# Patient Record
Sex: Female | Born: 1955
Health system: Southern US, Community
[De-identification: ages and names within clinical notes are randomized; demographics above are authoritative.]

## PROBLEM LIST (undated history)

## (undated) DIAGNOSIS — T8859XA Other complications of anesthesia, initial encounter: Secondary | ICD-10-CM

## (undated) DIAGNOSIS — E049 Nontoxic goiter, unspecified: Secondary | ICD-10-CM

## (undated) DIAGNOSIS — M797 Fibromyalgia: Secondary | ICD-10-CM

## (undated) DIAGNOSIS — J189 Pneumonia, unspecified organism: Secondary | ICD-10-CM

## (undated) DIAGNOSIS — R51 Headache: Secondary | ICD-10-CM

## (undated) DIAGNOSIS — T4145XA Adverse effect of unspecified anesthetic, initial encounter: Secondary | ICD-10-CM

## (undated) DIAGNOSIS — F329 Major depressive disorder, single episode, unspecified: Secondary | ICD-10-CM

## (undated) DIAGNOSIS — M419 Scoliosis, unspecified: Secondary | ICD-10-CM

## (undated) DIAGNOSIS — Z9189 Other specified personal risk factors, not elsewhere classified: Secondary | ICD-10-CM

## (undated) DIAGNOSIS — E785 Hyperlipidemia, unspecified: Secondary | ICD-10-CM

## (undated) DIAGNOSIS — K219 Gastro-esophageal reflux disease without esophagitis: Secondary | ICD-10-CM

## (undated) DIAGNOSIS — D649 Anemia, unspecified: Secondary | ICD-10-CM

## (undated) DIAGNOSIS — K449 Diaphragmatic hernia without obstruction or gangrene: Secondary | ICD-10-CM

## (undated) DIAGNOSIS — G9332 Myalgic encephalomyelitis/chronic fatigue syndrome: Secondary | ICD-10-CM

## (undated) DIAGNOSIS — K589 Irritable bowel syndrome without diarrhea: Secondary | ICD-10-CM

## (undated) DIAGNOSIS — F32A Depression, unspecified: Secondary | ICD-10-CM

## (undated) DIAGNOSIS — M1990 Unspecified osteoarthritis, unspecified site: Secondary | ICD-10-CM

## (undated) DIAGNOSIS — E739 Lactose intolerance, unspecified: Secondary | ICD-10-CM

## (undated) DIAGNOSIS — IMO0001 Reserved for inherently not codable concepts without codable children: Secondary | ICD-10-CM

## (undated) DIAGNOSIS — R5382 Chronic fatigue, unspecified: Secondary | ICD-10-CM

## (undated) DIAGNOSIS — Z889 Allergy status to unspecified drugs, medicaments and biological substances status: Secondary | ICD-10-CM

## (undated) DIAGNOSIS — N390 Urinary tract infection, site not specified: Secondary | ICD-10-CM

## (undated) HISTORY — DX: Myalgic encephalomyelitis/chronic fatigue syndrome: G93.32

## (undated) HISTORY — DX: Pneumonia, unspecified organism: J18.9

## (undated) HISTORY — DX: Anemia, unspecified: D64.9

## (undated) HISTORY — DX: Reserved for inherently not codable concepts without codable children: IMO0001

## (undated) HISTORY — DX: Scoliosis, unspecified: M41.9

## (undated) HISTORY — PX: FOOT SURGERY: SHX648

## (undated) HISTORY — DX: Major depressive disorder, single episode, unspecified: F32.9

## (undated) HISTORY — DX: Diaphragmatic hernia without obstruction or gangrene: K44.9

## (undated) HISTORY — DX: Lactose intolerance, unspecified: E73.9

## (undated) HISTORY — DX: Urinary tract infection, site not specified: N39.0

## (undated) HISTORY — DX: Gastro-esophageal reflux disease without esophagitis: K21.9

## (undated) HISTORY — DX: Unspecified osteoarthritis, unspecified site: M19.90

## (undated) HISTORY — DX: Hyperlipidemia, unspecified: E78.5

## (undated) HISTORY — DX: Headache: R51

## (undated) HISTORY — DX: Depression, unspecified: F32.A

## (undated) HISTORY — DX: Irritable bowel syndrome without diarrhea: K58.9

## (undated) HISTORY — PX: NOSE SURGERY: SHX723

## (undated) HISTORY — DX: Chronic fatigue, unspecified: R53.82

## (undated) HISTORY — PX: TONSILLECTOMY: SUR1361

---

## 2000-11-02 ENCOUNTER — Ambulatory Visit (HOSPITAL_COMMUNITY): Admission: RE | Admit: 2000-11-02 | Discharge: 2000-11-02 | Payer: Self-pay | Admitting: Family Medicine

## 2000-11-02 ENCOUNTER — Encounter: Payer: Self-pay | Admitting: Family Medicine

## 2001-10-05 ENCOUNTER — Encounter: Admission: RE | Admit: 2001-10-05 | Discharge: 2001-10-05 | Payer: Self-pay | Admitting: Family Medicine

## 2001-10-05 ENCOUNTER — Encounter: Payer: Self-pay | Admitting: Family Medicine

## 2004-05-03 ENCOUNTER — Other Ambulatory Visit: Admission: RE | Admit: 2004-05-03 | Discharge: 2004-05-03 | Payer: Self-pay | Admitting: Family Medicine

## 2004-12-25 ENCOUNTER — Emergency Department (HOSPITAL_COMMUNITY): Admission: EM | Admit: 2004-12-25 | Discharge: 2004-12-25 | Payer: Self-pay | Admitting: Emergency Medicine

## 2005-02-19 ENCOUNTER — Encounter: Admission: RE | Admit: 2005-02-19 | Discharge: 2005-02-19 | Payer: Self-pay | Admitting: Specialist

## 2005-02-22 ENCOUNTER — Encounter: Admission: RE | Admit: 2005-02-22 | Discharge: 2005-02-22 | Payer: Self-pay | Admitting: Specialist

## 2005-08-21 ENCOUNTER — Encounter: Admission: RE | Admit: 2005-08-21 | Discharge: 2005-08-21 | Payer: Self-pay | Admitting: Family Medicine

## 2007-03-09 ENCOUNTER — Encounter: Admission: RE | Admit: 2007-03-09 | Discharge: 2007-03-09 | Payer: Self-pay | Admitting: Family Medicine

## 2007-05-24 ENCOUNTER — Other Ambulatory Visit: Admission: RE | Admit: 2007-05-24 | Discharge: 2007-05-24 | Payer: Self-pay | Admitting: Family Medicine

## 2008-04-11 ENCOUNTER — Encounter: Admission: RE | Admit: 2008-04-11 | Discharge: 2008-04-11 | Payer: Self-pay | Admitting: Family Medicine

## 2008-04-18 ENCOUNTER — Encounter: Admission: RE | Admit: 2008-04-18 | Discharge: 2008-04-18 | Payer: Self-pay | Admitting: Family Medicine

## 2008-12-05 ENCOUNTER — Other Ambulatory Visit: Admission: RE | Admit: 2008-12-05 | Discharge: 2008-12-05 | Payer: Self-pay | Admitting: Family Medicine

## 2010-03-29 ENCOUNTER — Encounter: Admission: RE | Admit: 2010-03-29 | Discharge: 2010-03-29 | Payer: Self-pay | Admitting: Family Medicine

## 2010-05-31 ENCOUNTER — Encounter: Admission: RE | Admit: 2010-05-31 | Discharge: 2010-05-31 | Payer: Self-pay | Admitting: Family Medicine

## 2011-04-08 ENCOUNTER — Encounter: Payer: Self-pay | Admitting: Internal Medicine

## 2011-05-22 ENCOUNTER — Ambulatory Visit (HOSPITAL_COMMUNITY)
Admission: RE | Admit: 2011-05-22 | Payer: BC Managed Care – PPO | Source: Ambulatory Visit | Admitting: Gastroenterology

## 2011-05-30 ENCOUNTER — Encounter: Payer: Self-pay | Admitting: Internal Medicine

## 2011-05-30 ENCOUNTER — Ambulatory Visit (INDEPENDENT_AMBULATORY_CARE_PROVIDER_SITE_OTHER): Payer: BC Managed Care – PPO | Admitting: Internal Medicine

## 2011-05-30 VITALS — BP 120/70 | HR 68 | Ht 66.75 in | Wt 172.2 lb

## 2011-05-30 DIAGNOSIS — R195 Other fecal abnormalities: Secondary | ICD-10-CM

## 2011-05-30 DIAGNOSIS — Z800 Family history of malignant neoplasm of digestive organs: Secondary | ICD-10-CM

## 2011-05-30 MED ORDER — PEG-KCL-NACL-NASULF-NA ASC-C 100 G PO SOLR: 1.0000 | Freq: Once | ORAL | Status: DC

## 2011-05-30 NOTE — Patient Instructions (Signed)
You have been scheduled for an endoscopy and colonoscopy without sedation. Please follow written instructions given to you at your visit today.  Please pick up your Moviprep kit at the pharmacy within the next 2-3 days. CC: Dr Holley Bouche

## 2011-05-30 NOTE — Progress Notes (Signed)
Tanya Thomas 23-Jan-1956 MRN [id number]    History of Present Illness:  This is a 55 year old white female with a positive Hemoccult on home testing. She has done home testing in prior years and they were always negative. She denies any visible blood per rectum. There is a family history of colon cancer in her father at age 72. She has regular bowel habits; 1-2 bowel movements a day. She has multiple allergies including lactose intolerance. She brought with her list of allergies and has done research on all of them including sedatives. She is concerned about having endoscopic procedures due to sedation. She would like to have the procedure done without sedation. She also researched virtual colonoscopy or barium enema. She had a flexible sigmoidoscopy in 1989 by Dr Corinda Gubler who raised a question of colitis. No biopsies were taken at that time.   Past Medical History  Diagnosis Date  . Lactose intolerance   . IBS (irritable bowel syndrome)   . Chronic fatigue syndrome   . Reflux   . Anemia   . Arthritis   . Asthma   . Scoliosis   . Depression   . Hiatal hernia   . Hyperlipemia   . Pneumonia   . UTI (lower urinary tract infection)   . Headache    Past Surgical History  Procedure Date  . Tonsillectomy   . Feet surgery     bilateral bunion removal  . Nose surgery     reports that she has never smoked. She has never used smokeless tobacco. She reports that she does not drink alcohol or use illicit drugs. family history includes Breast cancer in her maternal aunt; Cancer in her maternal grandmother; Colon cancer in her father; Irritable bowel syndrome in her sister; Lung cancer in her maternal uncle; Pancreatic cancer in her maternal aunt; Prostate cancer in her maternal uncle; and Stomach cancer in her maternal aunt. Allergies  Allergen Reactions  . Lactose Intolerance (Gi)   . Latex   . Penicillins   . Sulfa Antibiotics   . Sulfites         Review of Systems: Denies dysphagia  odynophagia abdominal pain or hematochezia  The remainder of the 10 point ROS is negative except as outlined in H&P   Physical Exam: General appearance  Well developed, in no distress. Eyes- non icteric. HEENT nontraumatic, normocephalic. Mouth no lesions, tongue papillated, no cheilosis. Neck supple without adenopathy, thyroid not enlarged, no carotid bruits, no JVD. Lungs Clear to auscultation bilaterally. Cor normal S1, normal S2, regular rhythm, no murmur,  quiet precordium. Abdomen: Soft abdomen with normoactive bowel sounds. Left lower quadrant tenderness. No distention. No tympany. Rectal: Soft Hemoccult negative stool. Extremities no pedal edema. Skin no lesions. Neurological alert and oriented x 3. Psychological normal mood and affect, Very anxious.     Assessment and Plan:  Problem #1 Hemoccult-positive stool on home testing. It could not be reproduced on today's exam. She has risk factors for colon cancer. She is an appropriate candidate for a screening colonoscopy. She has several personal objections to sedation. We have discussed alternatives such as virtual colonoscopy or barium enema. Both procedures would be diagnostic only, and she would have to possibly have a colonoscopy if they show polyps. She would like to try the procedures without sedation. It is possible to go through a colonoscopy without sedation providing that her colon is easy to traverse. She says she would accept some sedation during the procedure if needed. She requested a flexible sigmoidoscopy  but it is my opinion that it would not be sufficient for a colorectal screening. She also would like to have an upper endoscopy without sedation. She will not be able to take oral anesthetic. We went ahead and scheduled both procedures tentatively without sedation. She will make further decisions as we go.  05/30/2011 Lina Sar

## 2011-06-11 ENCOUNTER — Telehealth: Payer: Self-pay | Admitting: Internal Medicine

## 2011-06-11 NOTE — Telephone Encounter (Signed)
This pt was extremely anxious about scheduling her colonoscopy. I will not be able to talk to her on the phone due to time constrains since she carries on with her concerns. I will see her in the office again .

## 2011-06-11 NOTE — Telephone Encounter (Signed)
Patient calling because she cannot take the Moviprep. It has lemon oil in it. She is allergic to lemons, grapefruit, oranges, limes. These cause headaches instantly for patient.  She has Nulightly at her home and she thinks she could take this. She is asking a lot of questions about sedation. She is very concerned about any possibility of getting sedation during the procedure. She is very afraid of reacting to anything she would be given. Patient wants to know who would decide to give her sedation and when it would be decided.(her procedure is scheduled without sedation) She has many questions and would like to speak with Dr. Juanda Chance. OV? Please, advise.

## 2011-06-12 NOTE — Telephone Encounter (Signed)
Unable to reach patient will try again later 

## 2011-06-12 NOTE — Telephone Encounter (Signed)
Spoke with patient and scheduled OV for 06/23/11 10:00 AM with Dr. Juanda Chance.

## 2011-06-12 NOTE — Telephone Encounter (Signed)
Spoke with patient and she will call me when she decides she can come for the appointment on 06/23/11

## 2011-06-16 ENCOUNTER — Other Ambulatory Visit: Payer: Self-pay | Admitting: Family Medicine

## 2011-06-16 DIAGNOSIS — Z1231 Encounter for screening mammogram for malignant neoplasm of breast: Secondary | ICD-10-CM

## 2011-06-20 ENCOUNTER — Other Ambulatory Visit: Payer: Self-pay | Admitting: Family Medicine

## 2011-06-20 ENCOUNTER — Ambulatory Visit
Admission: RE | Admit: 2011-06-20 | Discharge: 2011-06-20 | Disposition: A | Discharge status: Home / Self Care | Payer: BC Managed Care – PPO | Source: Ambulatory Visit | Attending: Family Medicine | Admitting: Family Medicine

## 2011-06-20 DIAGNOSIS — E0789 Other specified disorders of thyroid: Secondary | ICD-10-CM

## 2011-06-23 ENCOUNTER — Ambulatory Visit (INDEPENDENT_AMBULATORY_CARE_PROVIDER_SITE_OTHER): Payer: BC Managed Care – PPO | Admitting: Internal Medicine

## 2011-06-23 ENCOUNTER — Encounter: Payer: Self-pay | Admitting: Internal Medicine

## 2011-06-23 VITALS — BP 128/74 | HR 68 | Ht 66.0 in | Wt 172.0 lb

## 2011-06-23 DIAGNOSIS — R195 Other fecal abnormalities: Secondary | ICD-10-CM

## 2011-06-23 NOTE — Patient Instructions (Addendum)
CC: Dr Holley Bouche

## 2011-06-23 NOTE — Progress Notes (Signed)
Tanya Thomas 09/06/1955 MRN [id number]   History of Present Illness:  This is a 55 year old white female with Hemoccult-positive stool on a home test and a family history of colon cancer in her father at the age of 68. She was here on 05/30/2011 to discuss colonoscopy. She had anxiety concerning the procedure and sedation due to  multiple allergies. She was unable to make a decision as to the prep and type of sedation she wants. She came back today to continue the discussion about the procedure. She is afraid of Versed as well as of fentanyl and propofol. She now understands that a colonoscopy is the best way to screen for colon cancer. She says that she wants to be sure that she can survive her procedure and she wants to talk to the anesthesiologist to discuss her chances of complications with sedation. She is, however, not sure that she will follow anesthesiologist recommendations. She also has had hesitation concerning modification of her diet prior to the procedure since apparently she has multiple food allergies as well. She did not want a MoviPrep because it contains sulfide and she believes some of the preps contained sulfites as well.   Past Medical History  Diagnosis Date  . Lactose intolerance   . IBS (irritable bowel syndrome)   . Chronic fatigue syndrome   . Reflux   . Anemia   . Arthritis   . Asthma   . Scoliosis   . Depression   . Hiatal hernia   . Hyperlipemia   . Pneumonia   . UTI (lower urinary tract infection)   . Headache    Past Surgical History  Procedure Date  . Tonsillectomy   . Foot surgery     bilateral bunion removal  . Nose surgery     reports that she has never smoked. She has never used smokeless tobacco. She reports that she does not drink alcohol or use illicit drugs. family history includes Breast cancer in her maternal aunt; Cancer in her maternal grandmother; Colon cancer in her father; Irritable bowel syndrome in her sister; Lung cancer in her  maternal uncle; Pancreatic cancer in her maternal aunt; Prostate cancer in her maternal uncle; and Stomach cancer in her maternal aunt. Allergies  Allergen Reactions  . Sulfites Anaphylaxis  . Advil     Causes asthma   . Alcohol-Sulfur (Sulfur)   . Aspirin   . Cocaine   . Codeine   . Corn Starch   . Corn Syrup   . Demerol (Meperidine Hcl)   . Lactose Intolerance (Gi)   . Latex   . Lemon Oil   . Penicillins   . Phenergan   . Sulfa Antibiotics   . Tylenol (Acetaminophen)     Kidney infections         Review of Systems:  The remainder of the 10 point ROS is negative except as outlined in H&P    Assessment and Plan:  This is a 55 year old white female with excessive anxiety concerning a screening colonoscopy. She is at high risk for colon cancer because of her family history and personal history of heme positive stool. I have seen her now twice and we have not been able to resolve the basic questions of prep, type of sedation and/or no sedation and flexible sigmoidoscopy versus full colonoscopy. She will let me know about her decision. I will try to find out if there is a possibility of her meeting an anesthesiologist providing that she would respect  his recommendations.   06/23/2011 Lina Sar

## 2011-06-24 ENCOUNTER — Encounter: Payer: BC Managed Care – PPO | Admitting: Internal Medicine

## 2011-07-01 ENCOUNTER — Ambulatory Visit
Admission: RE | Admit: 2011-07-01 | Discharge: 2011-07-01 | Disposition: A | Discharge status: Home / Self Care | Payer: BC Managed Care – PPO | Source: Ambulatory Visit | Attending: Family Medicine | Admitting: Family Medicine

## 2011-07-01 DIAGNOSIS — Z1231 Encounter for screening mammogram for malignant neoplasm of breast: Secondary | ICD-10-CM

## 2011-07-03 ENCOUNTER — Encounter: Payer: BC Managed Care – PPO | Admitting: Internal Medicine

## 2012-02-16 ENCOUNTER — Other Ambulatory Visit: Payer: Self-pay | Admitting: Family Medicine

## 2012-02-16 DIAGNOSIS — E042 Nontoxic multinodular goiter: Secondary | ICD-10-CM

## 2012-02-19 ENCOUNTER — Ambulatory Visit
Admission: RE | Admit: 2012-02-19 | Discharge: 2012-02-19 | Disposition: A | Discharge status: Home / Self Care | Payer: BC Managed Care – PPO | Source: Ambulatory Visit | Attending: Family Medicine | Admitting: Family Medicine

## 2012-02-19 DIAGNOSIS — E042 Nontoxic multinodular goiter: Secondary | ICD-10-CM

## 2012-06-16 ENCOUNTER — Other Ambulatory Visit: Payer: Self-pay | Admitting: Family Medicine

## 2012-06-16 ENCOUNTER — Ambulatory Visit
Admission: RE | Admit: 2012-06-16 | Discharge: 2012-06-16 | Disposition: A | Discharge status: Home / Self Care | Payer: BC Managed Care – PPO | Source: Ambulatory Visit | Attending: Family Medicine | Admitting: Family Medicine

## 2012-06-16 DIAGNOSIS — N63 Unspecified lump in unspecified breast: Secondary | ICD-10-CM

## 2012-06-16 DIAGNOSIS — Z1231 Encounter for screening mammogram for malignant neoplasm of breast: Secondary | ICD-10-CM

## 2012-08-17 ENCOUNTER — Other Ambulatory Visit: Payer: Self-pay | Admitting: Family Medicine

## 2012-08-17 DIAGNOSIS — E042 Nontoxic multinodular goiter: Secondary | ICD-10-CM

## 2012-08-20 ENCOUNTER — Ambulatory Visit
Admission: RE | Admit: 2012-08-20 | Discharge: 2012-08-20 | Disposition: A | Discharge status: Home / Self Care | Payer: BC Managed Care – PPO | Source: Ambulatory Visit | Attending: Family Medicine | Admitting: Family Medicine

## 2012-08-20 DIAGNOSIS — E042 Nontoxic multinodular goiter: Secondary | ICD-10-CM

## 2014-05-12 ENCOUNTER — Other Ambulatory Visit: Payer: Self-pay | Admitting: Family Medicine

## 2014-05-12 ENCOUNTER — Other Ambulatory Visit (HOSPITAL_COMMUNITY)
Admission: RE | Admit: 2014-05-12 | Discharge: 2014-05-12 | Disposition: A | Discharge status: Home / Self Care | Payer: BC Managed Care – PPO | Source: Ambulatory Visit | Attending: Family Medicine | Admitting: Family Medicine

## 2014-05-12 DIAGNOSIS — Z0000 Encounter for general adult medical examination without abnormal findings: Secondary | ICD-10-CM | POA: Diagnosis not present

## 2014-05-16 ENCOUNTER — Other Ambulatory Visit: Payer: Self-pay | Admitting: Family Medicine

## 2014-05-16 DIAGNOSIS — E042 Nontoxic multinodular goiter: Secondary | ICD-10-CM

## 2014-05-16 LAB — CYTOLOGY - PAP

## 2014-05-19 ENCOUNTER — Ambulatory Visit
Admission: RE | Admit: 2014-05-19 | Discharge: 2014-05-19 | Disposition: A | Discharge status: Home / Self Care | Payer: BC Managed Care – PPO | Source: Ambulatory Visit | Attending: Family Medicine | Admitting: Family Medicine

## 2014-05-19 DIAGNOSIS — E042 Nontoxic multinodular goiter: Secondary | ICD-10-CM

## 2014-06-16 ENCOUNTER — Encounter (HOSPITAL_COMMUNITY): Payer: Self-pay | Admitting: Pharmacy Technician

## 2014-06-22 ENCOUNTER — Encounter (HOSPITAL_COMMUNITY): Payer: Self-pay | Admitting: *Deleted

## 2014-06-28 ENCOUNTER — Encounter (HOSPITAL_COMMUNITY)
Admission: RE | Admit: 2014-06-28 | Discharge: 2014-06-28 | Disposition: A | Discharge status: Home / Self Care | Payer: BC Managed Care – PPO | Source: Ambulatory Visit | Attending: Gastroenterology | Admitting: Gastroenterology

## 2014-06-28 ENCOUNTER — Encounter (HOSPITAL_COMMUNITY): Payer: Self-pay | Admitting: *Deleted

## 2014-06-28 ENCOUNTER — Ambulatory Visit (HOSPITAL_COMMUNITY): Payer: BC Managed Care – PPO | Admitting: Anesthesiology

## 2014-06-28 DIAGNOSIS — Z01818 Encounter for other preprocedural examination: Secondary | ICD-10-CM | POA: Insufficient documentation

## 2014-06-28 NOTE — Anesthesia Preprocedure Evaluation (Deleted)
Anesthesia Evaluation  Patient identified by MRN, date of birth, ID band Patient awake    Reviewed: Allergy & Precautions, H&P , NPO status , Patient's Chart, lab work & pertinent test results  Airway Mallampati: II  TM Distance: >3 FB Neck ROM: Full    Dental no notable dental hx.    Pulmonary asthma , pneumonia -,  breath sounds clear to auscultation  Pulmonary exam normal       Cardiovascular negative cardio ROS  Rhythm:Regular Rate:Normal     Neuro/Psych  Headaches, PSYCHIATRIC DISORDERS Depression  Neuromuscular disease    GI/Hepatic Neg liver ROS, hiatal hernia,   Endo/Other  negative endocrine ROS  Renal/GU negative Renal ROS  negative genitourinary   Musculoskeletal  (+) Arthritis -, Fibromyalgia -  Abdominal   Peds negative pediatric ROS (+)  Hematology  (+) anemia ,   Anesthesia Other Findings   Reproductive/Obstetrics negative OB ROS                             Anesthesia Physical Anesthesia Plan  ASA: II  Anesthesia Plan: MAC   Post-op Pain Management:    Induction: Intravenous  Airway Management Planned:   Additional Equipment:   Intra-op Plan:   Post-operative Plan: Extubation in OR  Informed Consent: I have reviewed the patients History and Physical, chart, labs and discussed the procedure including the risks, benefits and alternatives for the proposed anesthesia with the patient or authorized representative who has indicated his/her understanding and acceptance.   Dental advisory given  Plan Discussed with: CRNA  Anesthesia Plan Comments: (Patient very concerned about a drug allergy. Pharmacy has been called and our propofol does not have sulfites.)        Anesthesia Quick Evaluation

## 2014-06-28 NOTE — Pre-Procedure Instructions (Signed)
06-28-14 1310 Patient here for pre anesthesia consult, discussed allergy issues and  Anesthesia concerns with Dr. Eliseo Squires.

## 2014-07-05 ENCOUNTER — Other Ambulatory Visit: Payer: Self-pay | Admitting: Gastroenterology

## 2014-07-06 ENCOUNTER — Encounter (HOSPITAL_COMMUNITY)
Admission: RE | Disposition: A | Discharge status: Home / Self Care | Payer: Self-pay | Source: Ambulatory Visit | Attending: Gastroenterology

## 2014-07-06 ENCOUNTER — Ambulatory Visit (HOSPITAL_COMMUNITY)
Admission: RE | Admit: 2014-07-06 | Discharge: 2014-07-06 | Disposition: A | Discharge status: Home / Self Care | Payer: BC Managed Care – PPO | Source: Ambulatory Visit | Attending: Gastroenterology | Admitting: Gastroenterology

## 2014-07-06 ENCOUNTER — Encounter (HOSPITAL_COMMUNITY): Payer: Self-pay | Admitting: Gastroenterology

## 2014-07-06 DIAGNOSIS — E785 Hyperlipidemia, unspecified: Secondary | ICD-10-CM | POA: Insufficient documentation

## 2014-07-06 DIAGNOSIS — D649 Anemia, unspecified: Secondary | ICD-10-CM | POA: Insufficient documentation

## 2014-07-06 DIAGNOSIS — R5382 Chronic fatigue, unspecified: Secondary | ICD-10-CM | POA: Diagnosis not present

## 2014-07-06 DIAGNOSIS — M419 Scoliosis, unspecified: Secondary | ICD-10-CM | POA: Diagnosis not present

## 2014-07-06 DIAGNOSIS — K589 Irritable bowel syndrome without diarrhea: Secondary | ICD-10-CM | POA: Insufficient documentation

## 2014-07-06 DIAGNOSIS — R195 Other fecal abnormalities: Secondary | ICD-10-CM | POA: Diagnosis present

## 2014-07-06 DIAGNOSIS — D127 Benign neoplasm of rectosigmoid junction: Secondary | ICD-10-CM | POA: Insufficient documentation

## 2014-07-06 DIAGNOSIS — Z882 Allergy status to sulfonamides status: Secondary | ICD-10-CM | POA: Diagnosis not present

## 2014-07-06 DIAGNOSIS — D120 Benign neoplasm of cecum: Secondary | ICD-10-CM | POA: Diagnosis not present

## 2014-07-06 DIAGNOSIS — F329 Major depressive disorder, single episode, unspecified: Secondary | ICD-10-CM | POA: Insufficient documentation

## 2014-07-06 DIAGNOSIS — J45909 Unspecified asthma, uncomplicated: Secondary | ICD-10-CM | POA: Diagnosis not present

## 2014-07-06 DIAGNOSIS — K219 Gastro-esophageal reflux disease without esophagitis: Secondary | ICD-10-CM | POA: Diagnosis not present

## 2014-07-06 DIAGNOSIS — M797 Fibromyalgia: Secondary | ICD-10-CM | POA: Insufficient documentation

## 2014-07-06 DIAGNOSIS — Z800 Family history of malignant neoplasm of digestive organs: Secondary | ICD-10-CM | POA: Diagnosis not present

## 2014-07-06 HISTORY — DX: Other specified personal risk factors, not elsewhere classified: Z91.89

## 2014-07-06 HISTORY — DX: Adverse effect of unspecified anesthetic, initial encounter: T41.45XA

## 2014-07-06 HISTORY — DX: Nontoxic goiter, unspecified: E04.9

## 2014-07-06 HISTORY — PX: COLONOSCOPY WITH PROPOFOL: SHX5780

## 2014-07-06 HISTORY — PX: ESOPHAGOGASTRODUODENOSCOPY (EGD) WITH PROPOFOL: SHX5813

## 2014-07-06 HISTORY — DX: Other complications of anesthesia, initial encounter: T88.59XA

## 2014-07-06 HISTORY — DX: Allergy status to unspecified drugs, medicaments and biological substances: Z88.9

## 2014-07-06 HISTORY — DX: Fibromyalgia: M79.7

## 2014-07-06 SURGERY — ESOPHAGOGASTRODUODENOSCOPY (EGD) WITH PROPOFOL
Anesthesia: Monitor Anesthesia Care

## 2014-07-06 MED ORDER — PROPOFOL 10 MG/ML IV BOLUS
INTRAVENOUS | Status: AC
  Filled 2014-07-06: qty 20

## 2014-07-06 MED ORDER — SODIUM CHLORIDE 0.9 % IV SOLN: INTRAVENOUS | Status: DC

## 2014-07-06 SURGICAL SUPPLY — 25 items

## 2014-07-06 NOTE — H&P (Signed)
Subjective:   Patient is a 58 y.o. female presents with family history of colon cancer and positive stools.patient has severe sulfite allergy and this is been discussed in detail with her. She has met with anesthesia.. Procedure including risks and benefits discussed in office.  There are no active problems to display for this patient.  Past Medical History  Diagnosis Date  . Lactose intolerance   . IBS (irritable bowel syndrome)   . Chronic fatigue syndrome   . Reflux   . Anemia   . Arthritis   . Asthma   . Scoliosis   . Depression   . Hiatal hernia   . Hyperlipemia   . Pneumonia   . UTI (lower urinary tract infection)   . Headache(784.0)   . Fibromyalgia   . Goiter     multi-nodular goiter  . H/O multiple allergies     extreme with "Sulfites"    Past Surgical History  Procedure Laterality Date  . Tonsillectomy    . Foot surgery      bilateral bunion removal  . Nose surgery      Prescriptions prior to admission  Medication Sig Dispense Refill Last Dose  . albuterol (PROVENTIL) (2.5 MG/3ML) 0.083% nebulizer solution Take 2.5 mg by nebulization every 6 (six) hours as needed for wheezing or shortness of breath.    Past Week at Unknown time  . B Complex-C (B-COMPLEX WITH VITAMIN C) tablet Take 1 tablet by mouth daily.   Past Week at Unknown time  . carbamide peroxide (DEBROX) 6.5 % otic solution Place 5 drops into both ears as needed (wax).   Past Week at Unknown time  . diphenhydrAMINE (BENADRYL) 12.5 MG/5ML liquid Take 50 mg by mouth every 8 (eight) hours as needed for allergies. childrens benadryl   Past Week at Unknown time  . GuaiFENesin (MUCINEX FOR KIDS PO) Take 1 tablet by mouth at bedtime. (minimelts for kids)   07/05/2014 at 0900  . loratadine (CLARITIN) 5 MG/5ML syrup Take 10 mg by mouth as needed for allergies or rhinitis.    07/05/2014 at 1600  . OVER THE COUNTER MEDICATION Take 3-5 tablets by mouth at bedtime and may repeat dose one time if needed. (Solgar  Brand) Calcium Magnesium Vitamin d3 400 iu Cholecalciferol  500 Magnesium Oxide 1000 Calcium Citrate   Past Week at Unknown time  . ranitidine (ZANTAC) 150 MG tablet Take 150 mg by mouth 2 (two) times daily. (Can only take the one that has Red Oxide)   07/05/2014 at 1200  . sodium chloride (OCEAN) 0.65 % SOLN nasal spray Place 2 sprays into both nostrils daily as needed for congestion.   07/05/2014 at 2359  . EPINEPHrine (EPIPEN JR) 0.15 MG/0.3ML injection Inject 0.15 mg into the muscle as needed.     More than a month at Unknown time  . loratadine (CLARITIN) 5 MG/5ML syrup Take 10 mg by mouth daily as needed for allergies or rhinitis. Dye-Free.     . magnesium citrate 1.745 GM/30ML SOLN Take 296 mLs by mouth at bedtime.     More than a month at Unknown time  . peg 3350 powder (MOVIPREP) 100 G SOLR Take 1 kit (100 g total) by mouth once. 1 kit 0 More than a month at Unknown time   Allergies  Allergen Reactions  . Sulfites Anaphylaxis    FCD dye  . Alcohol-Sulfur [Sulfur]   . Aspirin     Purple spots all over back and passed out.   Marland Kitchen  Cocaine   . Codeine   . Corn Starch   . Corn Syrup   . Demerol [Meperidine Hcl]     Drop in blood pressure, jerking.   . Ibuprofen     Causes asthma   . Lactose Intolerance (Gi)   . Lemon Flavor     All citrus fruits and products-lemons, lime, oranges, grapefruit, tangerines, tangelos, clementines--ALL MAKE HER HAVE MIGRAINES  . Lemon Oil     Used in sleep study.(Lemon Prep)Face swelling.  . Monosodium Glutamate     migraines  . Nickel     Jewelry-Red rash, itching  . Penicillins     Purple spots all over back and passed out.   . Promethazine Hcl     Bp dropped and a feeling of passing out.  . Sulfa Antibiotics   . Tylenol [Acetaminophen]     Kidney infections   . Wine [Alcohol]     Throat swells  . Adhesive [Tape] Rash    Surgical tape-3M-rash NEEDS HYPOFIX TAPE -WORKS BETTER FOR HER!  . Latex Rash    History  Substance Use Topics  .  Smoking status: Never Smoker   . Smokeless tobacco: Never Used  . Alcohol Use: No    Family History  Problem Relation Age of Onset  . Colon cancer Father   . Irritable bowel syndrome Sister   . Breast cancer Maternal Aunt   . Pancreatic cancer Maternal Aunt   . Stomach cancer Maternal Aunt   . Prostate cancer Maternal Uncle   . Lung cancer Maternal Uncle   . Cancer Maternal Grandmother     rhabdomysarcoma     Objective:   Patient Vitals for the past 8 hrs:  BP Temp Temp src Pulse Resp SpO2 Height Weight  07/06/14 0807 (!) 165/98 mmHg 97.9 F (36.6 C) Oral 75 10 97 % 5' 6" (1.676 m) 79.379 kg (175 lb)         See MD Preop evaluation      Assessment:   1. Heme positive stool with family history of colon cancer 2. Sulfite allergy  Plan:   Will proceed with EGD and colonoscopy. At the patient's request she has chosen to do these procedures with no sedation. Anesthesia will be available if needed.   

## 2014-07-06 NOTE — Discharge Instructions (Signed)
No aspirin, ibuprofen or other NSAID medications for 5 days. Office will send note or call when pathology results are obtained. Colonoscopy will be repeated based on the pathology results.Esophagogastroduodenoscopy Care After Refer to this sheet in the next few weeks. These instructions provide you with information on caring for yourself after your procedure. Your caregiver may also give you more specific instructions. Your treatment has been planned according to current medical practices, but problems sometimes occur. Call your caregiver if you have any problems or questions after your procedure.  HOME CARE INSTRUCTIONS  Do not eat or drink anything until the numbing medicine (local anesthetic) has worn off and your gag reflex has returned. You will know that the local anesthetic has worn off when you can swallow comfortably.  Do not drive for 12 hours after the procedure or as directed by your caregiver.  Only take medicines as directed by your caregiver. SEEK MEDICAL CARE IF:   You cannot stop coughing.  You are not urinating at all or less than usual. SEEK IMMEDIATE MEDICAL CARE IF:  You have difficulty swallowing.  You cannot eat or drink.  You have worsening throat or chest pain.  You have dizziness, lightheadedness, or you faint.  You have nausea or vomiting.  You have chills.  You have a fever.  You have severe abdominal pain.  You have black, tarry, or bloody stools. Document Released: 07/28/2012 Document Reviewed: 07/28/2012 [facility] Patient Information 2015 [place]. This information is not intended to replace advice given to you by your health care provider. Make sure you discuss any questions you have with your health care provider.   Colonoscopy, Care After These instructions give you information on caring for yourself after your procedure. Your doctor may also give you more specific instructions. Call your doctor if you have any problems or questions  after your procedure. HOME CARE  Do not drive for 24 hours.  Do not sign important papers or use machinery for 24 hours.  You may shower.  You may go back to your usual activities, but go slower for the first 24 hours.  Take rest breaks often during the first 24 hours.  Walk around or use warm packs on your belly (abdomen) if you have belly cramping or gas.  Drink enough fluids to keep your pee (urine) clear or pale yellow.  Resume your normal diet. Avoid heavy or fried foods.  Avoid drinking alcohol for 24 hours or as told by your doctor.  Only take medicines as told by your doctor. If a tissue sample (biopsy) was taken during the procedure:   Do not take aspirin or blood thinners for 7 days, or as told by your doctor.  Do not drink alcohol for 7 days, or as told by your doctor.  Eat soft foods for the first 24 hours. GET HELP IF: You still have a small amount of blood in your poop (stool) 2-3 days after the procedure. GET HELP RIGHT AWAY IF:  You have more than a small amount of blood in your poop.  You see clumps of tissue (blood clots) in your poop.  Your belly is puffy (swollen).  You feel sick to your stomach (nauseous) or throw up (vomit).  You have a fever.  You have belly pain that gets worse and medicine does not help. MAKE SURE YOU:  Understand these instructions.  Will watch your condition.  Will get help right away if you are not doing well or get worse. Document Released: 09/13/2010  Document Revised: 08/16/2013 Document Reviewed: 04/18/2013 [facility] Patient Information 2015 [place], [place]. This information is not intended to replace advice given to you by your health care provider. Make sure you discuss any questions you have with your health care provider.

## 2014-07-06 NOTE — Op Note (Signed)
[facility] [place], [address]   COLONOSCOPY PROCEDURE REPORT     EXAM DATE: 07/06/2014  PATIENT NAME:      Tanya Thomas, Tanya Thomas           MR #:      [id number]  BIRTHDATE:       [birth year]      VISIT #:     [phone]  ATTENDING:     Laurence Spates, MD     STATUS:     outpatient REFERRING MD:      Shirline Frees, M.D. ASA CLASS:        Class III  INDICATIONS:  The patient is a 58 yr old female here for a colonoscopy due to heme positive stool and family history of colon cancer. PROCEDURE PERFORMED:     Colonoscopy with snare polypectomy MEDICATIONS:     patient received no medications at her request due to her sulfite allergy ESTIMATED BLOOD LOSS:     None  CONSENT: The patient understands the risks and benefits of the procedure and understands that these risks include, but are not limited to: sedation, allergic reaction, infection, perforation and/or bleeding. Alternative means of evaluation and treatment include, among others: physical exam, x-rays, and/or surgical intervention. The patient elects to proceed with this endoscopic procedure.  DESCRIPTION OF PROCEDURE: During intra-op preparation period all mechanical & medical equipment was checked for proper function. Hand hygiene and appropriate measures for infection prevention was taken. After the risks, benefits and alternatives of the procedure were thoroughly explained, Informed consent was verified, confirmed and timeout was successfully executed by the treatment team. A digital exam revealed no abnormalities of the rectum.      The Pentax Ped Colon [id number] endoscope was introduced through the anus and advanced to the cecum, which was identified by both the appendix and ileocecal valve. No adverse events experienced. The prep was excellent, using Nulytley. The instrument was then slowly withdrawn as the colon was fully examined.   COLON FINDINGS: Three-quarter centimeters sessile  polyp located in the cecum across from the ileocecal valve removed with hot snare polypectomy.   1 cm polyp and rectosigmoid 22 cm pedunculated removed hot snare polypectomy. Retroflexed views revealed no abnormalities.  The scope was then completely withdrawn from the patient and the procedure terminated. WITHDRAWAL TIME: 25 minutes 0 seconds    ADVERSE EVENTS:      There were no immediate complications.  IMPRESSIONS:     1.  Three-quarter centimeters sessile polyp located in the cecum across from the ileocecal valve removed with hot snare polypectomy 2.  1 cm polyp and rectosigmoid 22 cm pedunculated removed hot snare polypectomy 3.  Family History of Colon Cancer  RECOMMENDATIONS:     Repeat procedure based on path results.  Avoid all NSAIDs for at least 5 days. RECALL:  Laurence Spates, MD eSigned:  Laurence Spates, MD 07/06/2014 10:15 AM   cc:  Shirline Frees MD  CPT CODES: ICD CODES:  The ICD and CPT codes recommended by this software are interpretations from the data that the clinical staff has captured with the software.  The verification of the translation of this report to the ICD and CPT codes and modifiers is the sole responsibility of the health care institution and practicing physician where this report was generated.  [employer]. will not be held responsible for the validity of the ICD and CPT codes included on this report.  AMA assumes no liability for  data contained or not contained herein. CPT is a Designer, television/film set of the [employer].

## 2014-07-06 NOTE — Addendum Note (Signed)
Addended by: Vernie Ammons. on: 07/06/2014 08:48 AM   Modules accepted: Orders

## 2014-07-06 NOTE — Progress Notes (Signed)
Patient complaining of some pressure on her right abdomen related to procedure, some relief with expelling gas. Now 4/10

## 2014-07-06 NOTE — Op Note (Signed)
[facility] [place], [address]   ENDOSCOPY PROCEDURE REPORT  PATIENT: Tanya Thomas, Tanya Thomas  MR#: [id number] BIRTHDATE: 26-May-1956 , 33  yrs. old GENDER: female ENDOSCOPIST: Laurence Spates, MD REFERRED BY:  Shirline Frees, M.D. PROCEDURE DATE:  07/06/2014 PROCEDURE:  EGD, diagnostic ASA CLASS:     Class III INDICATIONS:  heme positive stool. MEDICATIONS: patient originally scheduled propofol sedation.  She has sulfite allergy and elected to undergo procedure with no sedation and no topical anesthesia TOPICAL ANESTHETIC: none  DESCRIPTION OF PROCEDURE: After the risks benefits and alternatives of the procedure were thoroughly explained, informed consent was obtained.  The Pentax Gastroscope [id number] endoscope was introduced through the mouth and advanced to the second portion of the duodenum , Without limitations.  The instrument was slowly withdrawn as the mucosa was fully examined.      ESOPHAGUS: The mucosa of the esophagus appeared normal.  STOMACH: The mucosa of the stomach appeared normal.  DUODENUM: The duodenal mucosa showed no abnormalities.  Retroflexed views revealed no abnormalities.     The scope was then withdrawn from the patient and the procedure completed.  COMPLICATIONS: There were no immediate complications.    the patient did gag throughout the procedure making it somewhat difficult there were no immediate problems.  ENDOSCOPIC IMPRESSION: 1.   The mucosa of the esophagus appeared normal 2.   The mucosa of the stomach appeared normal 3.   The duodenal mucosa showed no abnormalities  RECOMMENDATIONS: We will proceed with colonoscopy at this time  REPEAT EXAM:  eSigned:  Laurence Spates, MD 07/06/2014 10:07 AM    ON:GEXBMW, Gwyndolyn Saxon MD  CPT CODES: ICD CODES:  The ICD and CPT codes recommended by this software are interpretations from the data that the clinical staff has captured with the software.  The verification  of the translation of this report to the ICD and CPT codes and modifiers is the sole responsibility of the health care institution and practicing physician where this report was generated.  [employer]. will not be held responsible for the validity of the ICD and CPT codes included on this report.  AMA assumes no liability for data contained or not contained herein. CPT is a Designer, television/film set of the [employer].  PATIENT NAME:  Billee, Balcerzak MR#: [id number]

## 2014-07-07 ENCOUNTER — Encounter (HOSPITAL_COMMUNITY): Payer: Self-pay | Admitting: Gastroenterology

## 2015-05-25 ENCOUNTER — Telehealth: Payer: Self-pay

## 2015-05-25 NOTE — Telephone Encounter (Signed)
Pt. called to inquire about she and daughter Tanya Thomas receiving their flu shot at our office again this year.  Pt. states she is latex allergic and sulfite sensitive and we had a flu vaccine she could tolerate last year.   Advised patient  Dr. Neldon Mc would not be back in the office until Tuesday. Patient voices understanding.

## 2015-05-28 NOTE — Telephone Encounter (Signed)
Called patient, left message on voicemail to return call.

## 2015-05-28 NOTE — Telephone Encounter (Signed)
Flu Vaccine is in [facility] office.  When patient calls back we need to schedule an appointment for she and her daughter to come in to receive injection.

## 2015-05-28 NOTE — Telephone Encounter (Signed)
Please inform Tomma that we can administer Flu vaccine in clinic for both her and Larene [place]. Please arrange for that vaccine to be administered when our supply come in.

## 2015-05-28 NOTE — Telephone Encounter (Signed)
Patient returned my call.  Advised we could schedule her appointment to come in and receive flu shot.  She would like to know if our influenza vaccine is sulfite and latex free.  She is requesting 1.)  name of the vaccine, 2.)  manufacturer and 3.) phone number so she can call them herself to verify ingredients.  Please call her to advise those 3 things.  She doesn't want to schedule appointment until verification is done.

## 2015-05-29 NOTE — Telephone Encounter (Signed)
Left message for patient to call back - Need to inform her of the following information that she requested about the Flu Vaccine we have.  1) Name of Vaccine:   Fluarix Quadrivalent  2) Manufacturer:   [place] ([place])  3) Phone # for her to call:   [phone]

## 2015-05-29 NOTE — Telephone Encounter (Signed)
Patient informed - She states that she will get in touch with [place] and call us back.

## 2016-06-13 ENCOUNTER — Other Ambulatory Visit: Payer: Self-pay | Admitting: Family Medicine

## 2016-06-13 DIAGNOSIS — Z1231 Encounter for screening mammogram for malignant neoplasm of breast: Secondary | ICD-10-CM

## 2016-06-25 ENCOUNTER — Ambulatory Visit
Admission: RE | Admit: 2016-06-25 | Discharge: 2016-06-25 | Disposition: A | Discharge status: Home / Self Care | Payer: BLUE CROSS/BLUE SHIELD | Source: Ambulatory Visit | Attending: Family Medicine | Admitting: Family Medicine

## 2016-06-25 DIAGNOSIS — Z1231 Encounter for screening mammogram for malignant neoplasm of breast: Secondary | ICD-10-CM

## 2016-10-03 ENCOUNTER — Telehealth: Payer: Self-pay | Admitting: Allergy and Immunology

## 2016-10-03 NOTE — Telephone Encounter (Signed)
Patient was given generic Tami Flu because her family has all come down with the flu. She wants to know if she was ever tested for gelatin because she is afraid that is in the medication and doesn't want to have an allergic reaction.

## 2016-10-03 NOTE — Telephone Encounter (Signed)
Spoke to patient and she is concerned about taking the tamiflu capsules because she can not remember if she is allergic to gelatin or not. Patient stated that she has not been seen in over 2 years. She did speak with Tammy Voncannon yesterday about the same thing. I spoke with Tammy today and informed her that I had told the patient that she would need to contact the prescribing doctor. Also advised that if she felt she was having a severe reaction to seek medical attention immediately. I alos told her to set up an appointment so that we could get everything up to date in her chart. I did tell patient that she may not hear anything before Monday because Dr. Neldon Mc is out of the office today.

## 2016-12-22 ENCOUNTER — Encounter (HOSPITAL_COMMUNITY): Payer: Self-pay | Admitting: Emergency Medicine

## 2016-12-22 ENCOUNTER — Emergency Department (HOSPITAL_COMMUNITY): Payer: BLUE CROSS/BLUE SHIELD

## 2016-12-22 ENCOUNTER — Emergency Department (HOSPITAL_COMMUNITY)
Admission: EM | Admit: 2016-12-22 | Discharge: 2016-12-22 | Disposition: A | Discharge status: Home / Self Care | Payer: BLUE CROSS/BLUE SHIELD | Attending: Emergency Medicine | Admitting: Emergency Medicine

## 2016-12-22 DIAGNOSIS — J45909 Unspecified asthma, uncomplicated: Secondary | ICD-10-CM | POA: Diagnosis not present

## 2016-12-22 DIAGNOSIS — R1031 Right lower quadrant pain: Secondary | ICD-10-CM | POA: Diagnosis present

## 2016-12-22 DIAGNOSIS — Z91040 Latex allergy status: Secondary | ICD-10-CM | POA: Insufficient documentation

## 2016-12-22 LAB — COMPREHENSIVE METABOLIC PANEL
ALT: 17 U/L (ref 14–54)
AST: 19 U/L (ref 15–41)
Albumin: 4.7 g/dL (ref 3.5–5.0)
Alkaline Phosphatase: 75 U/L (ref 38–126)
Anion gap: 8 (ref 5–15)
BUN: 14 mg/dL (ref 6–20)
CO2: 27 mmol/L (ref 22–32)
Calcium: 9.4 mg/dL (ref 8.9–10.3)
Chloride: 105 mmol/L (ref 101–111)
Creatinine, Ser: 0.65 mg/dL (ref 0.44–1.00)
GFR calc Af Amer: >60 mL/min (ref >60)
GFR calc non Af Amer: >60 mL/min (ref >60)
Glucose, Bld: 108 mg/dL — ABNORMAL HIGH (ref 65–99)
Potassium: 4.1 mmol/L (ref 3.5–5.1)
Sodium: 140 mmol/L (ref 135–145)
Total Bilirubin: 1.2 mg/dL (ref 0.3–1.2)
Total Protein: 8.2 g/dL — ABNORMAL HIGH (ref 6.5–8.1)

## 2016-12-22 LAB — URINALYSIS, ROUTINE W REFLEX MICROSCOPIC
Bilirubin Urine: NEGATIVE
Glucose, UA: NEGATIVE mg/dL
Hgb urine dipstick: NEGATIVE
Ketones, ur: 20 mg/dL — AB
Leukocytes, UA: NEGATIVE
Nitrite: NEGATIVE
Protein, ur: NEGATIVE mg/dL
Specific Gravity, Urine: 1.016 (ref 1.005–1.030)
pH: 7 (ref 5.0–8.0)

## 2016-12-22 LAB — CBC
HCT: 42 % (ref 36.0–46.0)
Hemoglobin: 14.5 g/dL (ref 12.0–15.0)
MCH: 30.1 pg (ref 26.0–34.0)
MCHC: 34.5 g/dL (ref 30.0–36.0)
MCV: 87.1 fL (ref 78.0–100.0)
Platelets: 259 10*3/uL (ref 150–400)
RBC: 4.82 MIL/uL (ref 3.87–5.11)
RDW: 12.9 % (ref 11.5–15.5)
WBC: 6.9 10*3/uL (ref 4.0–10.5)

## 2016-12-22 LAB — LIPASE, BLOOD: Lipase: 23 U/L (ref 11–51)

## 2016-12-22 NOTE — ED Triage Notes (Signed)
Patient states that she woke  Up with right groin pain and went to [facility] but was sent here for further evaluation. Patient reports that pain is dull and intermittent. Patient states that she normally has diarrhea.

## 2016-12-22 NOTE — Discharge Instructions (Signed)
Your CT today did not provide an obvious explanation for your pain. Your appendix appeared normal. No kidney stones. Small amount of diverticulosis, but no active inflammation. Simple (benign) appearing liver cysts. You seem to be most tender along your inguinal crease. I did not appreciate an obvious hernia there. I doubt an emergent condition causing your symptoms.

## 2016-12-22 NOTE — ED Notes (Signed)
Pt ambulatory and independent at discharge.  Verbalized understanding of discharge instructions 

## 2016-12-22 NOTE — ED Provider Notes (Signed)
[facility] DEPT Provider Note   CSN: [id number] Arrival date & time: 12/22/16  1657  By signing my name below, I, [name], attest that this documentation has been prepared under the direction and in the presence of physician practitioner, Virgel Manifold, MD. Electronically Signed: [name], Scribe. 12/22/2016. 6:16 PM.  History   Chief Complaint Chief Complaint  Patient presents with  . Groin Pain   The history is provided by the patient. No language interpreter was used.    HPI Comments: Tanya Thomas is a 61 y.o. female with PMHx including fibromyalgia, IBS, and UTI who presents to the Emergency Department complaining of sudden onset, intermittent, sharp pain in her right pelvic area beginning yesterday. She reports some associated increased flatulence. No modifying factors noted. Pt notes that she ate a beef hot dog yesterday and does not usually eat these; she believes this could be the reason for her current symptoms. Patient also reports some recent consumption of eggs and lettuce that have been recalled by the FDA. She was evaluated at [facility] earlier today and was advised to come to the ED for a CT abdomen/pelvis. Patient reports some diarrhea at baseline without any recent changes in her stools. No recent unusual activity or over-exertion. She has several listed allergies to pain medications. She denies difficulty urinating, dysuria, nausea, vomiting, hematochezia, or any other associated symptoms.   Past Medical History:  Diagnosis Date  . Anemia   . Arthritis   . Asthma   . Chronic fatigue syndrome   . Complication of anesthesia    numerous medication allergies  . Depression   . Fibromyalgia   . Goiter    multi-nodular goiter  . H/O multiple allergies    extreme with "Sulfites"  . Headache(784.0)   . Hiatal hernia   . Hyperlipemia   . IBS (irritable bowel syndrome)   . Lactose intolerance   . Pneumonia   . Reflux   . Scoliosis   . UTI  (lower urinary tract infection)     There are no active problems to display for this patient.   Past Surgical History:  Procedure Laterality Date  . COLONOSCOPY WITH PROPOFOL N/A 07/06/2014   Procedure: COLONOSCOPY WITH PROPOFOL;  Surgeon: Winfield Cunas., MD;  Location: [facility] ENDOSCOPY;  Service: Endoscopy;  Laterality: N/A;  . ESOPHAGOGASTRODUODENOSCOPY (EGD) WITH PROPOFOL N/A 07/06/2014   Procedure: ESOPHAGOGASTRODUODENOSCOPY (EGD) WITH PROPOFOL;  Surgeon: Winfield Cunas., MD;  Location: [facility] ENDOSCOPY;  Service: Endoscopy;  Laterality: N/A;  . FOOT SURGERY     bilateral bunion removal  . NOSE SURGERY    . TONSILLECTOMY      OB History    No data available       Home Medications    Prior to Admission medications   Medication Sig Start Date End Date Taking? Authorizing Provider  albuterol (PROVENTIL) (2.5 MG/3ML) 0.083% nebulizer solution Take 2.5 mg by nebulization every 6 (six) hours as needed for wheezing or shortness of breath.     Historical Provider, MD  B Complex-C (B-COMPLEX WITH VITAMIN C) tablet Take 1 tablet by mouth daily.    Historical Provider, MD  carbamide peroxide (DEBROX) 6.5 % otic solution Place 5 drops into both ears as needed (wax).    Historical Provider, MD  diphenhydrAMINE (BENADRYL) 12.5 MG/5ML liquid Take 50 mg by mouth every 8 (eight) hours as needed for allergies. childrens benadryl    Historical Provider, MD  EPINEPHrine (EPIPEN JR) 0.15 MG/0.3ML injection Inject 0.15  mg into the muscle as needed.      Historical Provider, MD  GuaiFENesin (MUCINEX FOR KIDS PO) Take 1 tablet by mouth at bedtime. (minimelts for kids)    Historical Provider, MD  loratadine (CLARITIN) 5 MG/5ML syrup Take 10 mg by mouth as needed for allergies or rhinitis.     Historical Provider, MD  loratadine (CLARITIN) 5 MG/5ML syrup Take 10 mg by mouth daily as needed for allergies or rhinitis. Dye-Free.    Historical Provider, MD  magnesium citrate 1.745 GM/30ML SOLN Take 296  mLs by mouth at bedtime.      Historical Provider, MD  OVER THE COUNTER MEDICATION Take 3-5 tablets by mouth at bedtime and may repeat dose one time if needed. (Solgar Brand) Calcium Magnesium Vitamin d3 400 iu Cholecalciferol  500 Magnesium Oxide 1000 Calcium Citrate    Historical Provider, MD  peg 3350 powder (MOVIPREP) 100 G SOLR Take 1 kit (100 g total) by mouth once. 05/30/11   [place] Dragon, MD  ranitidine (ZANTAC) 150 MG tablet Take 150 mg by mouth 2 (two) times daily. (Can only take the one that has Red Oxide)    Historical Provider, MD  sodium chloride (OCEAN) 0.65 % SOLN nasal spray Place 2 sprays into both nostrils daily as needed for congestion.    Historical Provider, MD    Family History Family History  Problem Relation Age of Onset  . Colon cancer Father   . Irritable bowel syndrome Sister   . Breast cancer Maternal Aunt   . Pancreatic cancer Maternal Aunt   . Stomach cancer Maternal Aunt   . Prostate cancer Maternal Uncle   . Lung cancer Maternal Uncle   . Cancer Maternal Grandmother     rhabdomysarcoma    Social History Social History  Substance Use Topics  . Smoking status: Never Smoker  . Smokeless tobacco: Never Used  . Alcohol use No     Allergies   Sulfites; Alcohol-sulfur [sulfur]; Aspirin; Cocaine; Codeine; Corn starch; Corn syrup; Demerol [meperidine hcl]; Ibuprofen; Lactose intolerance (gi); Lemon flavor; Lemon oil; Monosodium glutamate; Nickel; Penicillins; Promethazine hcl; Sulfa antibiotics; Tylenol [acetaminophen]; Wine [alcohol]; Adhesive [tape]; and Latex   Review of Systems Review of Systems  Gastrointestinal: Positive for diarrhea (baseline). Negative for blood in stool, nausea and vomiting.  Genitourinary: Positive for pelvic pain. Negative for difficulty urinating and dysuria.  All other systems reviewed and are negative.  Physical Exam Updated Vital Signs BP (!) 162/101 (BP Location: Left Arm)   Pulse 93   Temp 98.8 F (37.1 C)  (Oral)   Resp 18   Ht 5' 6.5" (1.689 m)   Wt 178 lb (80.7 kg)   LMP 03/30/2011   SpO2 99%   BMI 28.30 kg/m   Physical Exam  Constitutional: She appears well-developed and well-nourished.  HENT:  Head: Normocephalic.  Right Ear: External ear normal.  Left Ear: External ear normal.  Nose: Nose normal.  Mouth/Throat: Oropharynx is clear and moist.  Eyes: Conjunctivae are normal. Right eye exhibits no discharge. Left eye exhibits no discharge.  Neck: Normal range of motion.  Cardiovascular: Normal rate, regular rhythm and normal heart sounds.   No murmur heard. Pulmonary/Chest: Effort normal and breath sounds normal. No respiratory distress. She has no wheezes. She has no rales.  Abdominal: Soft. She exhibits no distension. There is tenderness. There is no rebound and no guarding.  Tender over right inguinal crease.  Musculoskeletal: Normal range of motion. She exhibits no edema or tenderness.  Neurological: She is alert. No cranial nerve deficit. Coordination normal.  Skin: Skin is warm and dry. No rash noted. No erythema. No pallor.  Psychiatric: Her behavior is normal. Her mood appears anxious.  Nursing note and vitals reviewed.  ED Treatments / Results  Labs (all labs ordered are listed, but only abnormal results are displayed) Labs Reviewed  COMPREHENSIVE METABOLIC PANEL - Abnormal; Notable for the following:       Result Value   Glucose, Bld 108 (*)    Total Protein 8.2 (*)    All other components within normal limits  URINALYSIS, ROUTINE W REFLEX MICROSCOPIC - Abnormal; Notable for the following:    Ketones, ur 20 (*)    All other components within normal limits  LIPASE, BLOOD  CBC    EKG  EKG Interpretation None       Radiology No results found.   Ct Abdomen Pelvis Wo Contrast  Result Date: 12/22/2016 CLINICAL DATA:  Right lower quadrant pain for 1 day. EXAM: CT ABDOMEN AND PELVIS WITHOUT CONTRAST TECHNIQUE: Multidetector CT imaging of the abdomen and  pelvis was performed following the standard protocol without IV contrast. COMPARISON:  None. FINDINGS: Lower chest: No significant pulmonary nodules or acute consolidative airspace disease. Hepatobiliary: Numerous (greater than 15) simple liver cysts scattered throughout the liver, largest 7.3 cm in the central right liver lobe. Additional subcentimeter hypodense lesions scattered throughout the liver, too small to characterize. Normal gallbladder with no radiopaque cholelithiasis. No biliary ductal dilatation. Pancreas: Normal, with no mass or duct dilation. Spleen: Normal size. No mass. Adrenals/Urinary Tract: Normal adrenals. No renal stones. No hydronephrosis. No contour deforming renal mass. Normal bladder. Stomach/Bowel: Grossly normal stomach. Normal caliber small bowel with no small bowel wall thickening. Normal appendix. Scattered mild colonic diverticulosis, with no large bowel wall thickening or pericolonic fat stranding. Vascular/Lymphatic: Normal caliber abdominal aorta. No pathologically enlarged lymph nodes in the abdomen or pelvis. Reproductive: Grossly normal uterus.  No adnexal mass. Other: No pneumoperitoneum, ascites or focal fluid collection. Musculoskeletal: No aggressive appearing focal osseous lesions. Mild lumbar spondylosis. IMPRESSION: 1. No acute abnormality. No evidence of bowel obstruction or acute bowel inflammation. Normal appendix. Mild colonic diverticulosis, with no evidence of acute diverticulitis. 2. Polycystic liver. Electronically Signed   By: Ilona Sorrel M.D.   On: 12/22/2016 19:19   Procedures Procedures (including critical care time)  DIAGNOSTIC STUDIES: Oxygen Saturation is 99% on RA, normal by my interpretation.    COORDINATION OF CARE: 6:35 PM Discussed treatment plan with pt at bedside and pt agreed to plan.  Medications Ordered in ED Medications - No data to display   Initial Impression / Assessment and Plan / ED Course  I have reviewed the triage vital  signs and the nursing notes.  Pertinent labs & imaging results that were available during my care of the patient were reviewed by me and considered in my medical decision making (see chart for details).     60 year old female with right lower quadrant/right inguinal pain. She is point tender right over the inguinal crease. I suspect that this is musculoskeletal in etiology although she denies any acute trauma or strain. She is neurovascularly intact. She is not actually tender up in her abdomen. Patient is very anxious. It is hard for her to make a decision. Alternately, she did have a CT which was negative for any acute pathology. She cites multiple allergies. Advised stretching and Tylenol as needed. Return precautions discussed. Outpatient follow-up otherwise.  Final Clinical Impressions(s) /  ED Diagnoses   Final diagnoses:  Right inguinal pain    New Prescriptions New Prescriptions   No medications on file   I personally preformed the services scribed in my presence. The recorded information has been reviewed is accurate. Virgel Manifold, MD.    Virgel Manifold, MD 12/28/16 1016

## 2017-06-11 ENCOUNTER — Other Ambulatory Visit (HOSPITAL_COMMUNITY)
Admission: RE | Admit: 2017-06-11 | Discharge: 2017-06-11 | Disposition: A | Discharge status: Home / Self Care | Payer: BLUE CROSS/BLUE SHIELD | Source: Ambulatory Visit | Attending: Family Medicine | Admitting: Family Medicine

## 2017-06-11 ENCOUNTER — Other Ambulatory Visit: Payer: Self-pay | Admitting: Family Medicine

## 2017-06-11 DIAGNOSIS — Z01419 Encounter for gynecological examination (general) (routine) without abnormal findings: Secondary | ICD-10-CM | POA: Insufficient documentation

## 2017-06-12 LAB — CYTOLOGY - PAP: DIAGNOSIS: NEGATIVE

## 2017-08-06 DIAGNOSIS — M79605 Pain in left leg: Secondary | ICD-10-CM | POA: Diagnosis not present

## 2017-08-19 DIAGNOSIS — R7303 Prediabetes: Secondary | ICD-10-CM | POA: Diagnosis not present

## 2017-12-18 DIAGNOSIS — L539 Erythematous condition, unspecified: Secondary | ICD-10-CM | POA: Diagnosis not present

## 2017-12-18 DIAGNOSIS — Z0184 Encounter for antibody response examination: Secondary | ICD-10-CM | POA: Diagnosis not present

## 2017-12-18 DIAGNOSIS — R7303 Prediabetes: Secondary | ICD-10-CM | POA: Diagnosis not present

## 2018-02-18 DIAGNOSIS — B349 Viral infection, unspecified: Secondary | ICD-10-CM | POA: Diagnosis not present

## 2018-05-14 DIAGNOSIS — Z23 Encounter for immunization: Secondary | ICD-10-CM | POA: Diagnosis not present

## 2018-06-14 DIAGNOSIS — R7303 Prediabetes: Secondary | ICD-10-CM | POA: Diagnosis not present

## 2018-06-14 DIAGNOSIS — Z0000 Encounter for general adult medical examination without abnormal findings: Secondary | ICD-10-CM | POA: Diagnosis not present

## 2018-06-14 DIAGNOSIS — J45909 Unspecified asthma, uncomplicated: Secondary | ICD-10-CM | POA: Diagnosis not present

## 2018-06-14 DIAGNOSIS — G2581 Restless legs syndrome: Secondary | ICD-10-CM | POA: Diagnosis not present

## 2018-06-14 DIAGNOSIS — R0789 Other chest pain: Secondary | ICD-10-CM | POA: Diagnosis not present

## 2018-08-24 DIAGNOSIS — H43811 Vitreous degeneration, right eye: Secondary | ICD-10-CM | POA: Diagnosis not present

## 2019-03-24 DIAGNOSIS — R1011 Right upper quadrant pain: Secondary | ICD-10-CM | POA: Diagnosis not present

## 2019-04-08 ENCOUNTER — Other Ambulatory Visit: Payer: Self-pay | Admitting: Family Medicine

## 2019-04-08 DIAGNOSIS — R1011 Right upper quadrant pain: Secondary | ICD-10-CM

## 2019-04-11 ENCOUNTER — Ambulatory Visit
Admission: RE | Admit: 2019-04-11 | Discharge: 2019-04-11 | Disposition: A | Discharge status: Home / Self Care | Payer: BC Managed Care – PPO | Source: Ambulatory Visit | Attending: Family Medicine | Admitting: Family Medicine

## 2019-04-11 DIAGNOSIS — K7689 Other specified diseases of liver: Secondary | ICD-10-CM | POA: Diagnosis not present

## 2019-04-11 DIAGNOSIS — R1011 Right upper quadrant pain: Secondary | ICD-10-CM

## 2019-06-13 DIAGNOSIS — Z23 Encounter for immunization: Secondary | ICD-10-CM | POA: Diagnosis not present

## 2019-06-16 ENCOUNTER — Ambulatory Visit: Payer: BC Managed Care – PPO | Admitting: Allergy and Immunology

## 2019-06-16 ENCOUNTER — Encounter: Payer: Self-pay | Admitting: Allergy and Immunology

## 2019-06-16 ENCOUNTER — Other Ambulatory Visit: Payer: Self-pay

## 2019-06-16 VITALS — BP 136/88 | HR 86 | Resp 16 | Ht 65.5 in | Wt 158.0 lb

## 2019-06-16 DIAGNOSIS — Z9102 Food additives allergy status: Secondary | ICD-10-CM

## 2019-06-16 NOTE — Progress Notes (Signed)
Tanya Thomas presents to this clinic for discussion regarding an upcoming procedure that may need to be completed on her left mandibular molar.  Apparently she has a infected tooth underneath either a crown or a cap for which she started doxycycline 2 days ago.  She is actually little bit better today.  This is the first time she has ever had any problem with that issue.  Tanya Thomas has a concern about many different medication exposures.  She carries the diagnosis of "sulfite allergy".  She is worried about sulfites contained within topical anesthetic, sedative agents, and pain medications and foods.  She is basically worried about sulfates contained within any type of medication.    I had a long talk today with Tanya Thomas about this issue.  I recommend that she use a full 20 days of antibiotic and it is possible that her infection will resolve at that point in time and will not require any additional intervention.  If intervention is required she needs to visit with an oral surgeon to discuss the option of either topical anesthetic or sedation.  If she is very worried about topical anesthetic reaction then we can always performed an incremental challenge protocol with a topical anesthetic selected by her oral surgeon.  I also made the recommendation that she consider trying tramadol as a pain reliever should it be required as she is intolerant of using acetaminophen and nonsteroidal anti-inflammatory drugs.  She will keep in contact with me noting her response to this approach.  Further evaluation and treatment will be based upon her response.

## 2019-06-16 NOTE — Patient Instructions (Addendum)
  1.  Use 20 days of antibiotic  2.  Meet with oral surgeon.  Sedation versus topical anesthetic?  3.  Consider tramadol for pain control

## 2019-06-20 DIAGNOSIS — Z9102 Food additives allergy status: Secondary | ICD-10-CM | POA: Diagnosis not present

## 2019-06-20 DIAGNOSIS — Z86010 Personal history of colonic polyps: Secondary | ICD-10-CM | POA: Diagnosis not present

## 2019-06-20 DIAGNOSIS — Z0000 Encounter for general adult medical examination without abnormal findings: Secondary | ICD-10-CM | POA: Diagnosis not present

## 2019-06-20 DIAGNOSIS — J45909 Unspecified asthma, uncomplicated: Secondary | ICD-10-CM | POA: Diagnosis not present

## 2019-06-23 ENCOUNTER — Ambulatory Visit: Payer: BC Managed Care – PPO | Admitting: Allergy and Immunology

## 2019-06-28 ENCOUNTER — Ambulatory Visit: Payer: Self-pay | Admitting: Allergy

## 2019-06-29 ENCOUNTER — Encounter: Payer: Self-pay | Admitting: Allergy and Immunology

## 2019-06-29 ENCOUNTER — Ambulatory Visit (INDEPENDENT_AMBULATORY_CARE_PROVIDER_SITE_OTHER): Payer: BC Managed Care – PPO | Admitting: Allergy and Immunology

## 2019-06-29 ENCOUNTER — Other Ambulatory Visit: Payer: Self-pay

## 2019-06-29 VITALS — BP 142/92 | HR 77 | Temp 98.2°F | Resp 18

## 2019-06-29 DIAGNOSIS — T413X5D Adverse effect of local anesthetics, subsequent encounter: Secondary | ICD-10-CM

## 2019-06-30 ENCOUNTER — Encounter: Payer: Self-pay | Admitting: Allergy and Immunology

## 2019-06-30 NOTE — Progress Notes (Signed)
Tanya Thomas presents to this clinic to have a topical anesthetic challenge.  Using a in clinic challenge protocol she was exposed to approximately 1.5 mL of mepivacaine 3% via a epi cutaneous, intradermal, and subcutaneous exposure over the course of 3 hours and at no point in time did she ever have any adverse effect secondary to this exposure.

## 2019-11-02 ENCOUNTER — Encounter: Payer: Self-pay | Admitting: Allergy and Immunology

## 2020-01-18 IMAGING — US ULTRASOUND ABDOMEN LIMITED
1 series · 14 of 25 positions shown · non-contrast
Comparison: CT abdomen dated 12/22/2016.

CLINICAL DATA: RIGHT upper quadrant abdominal pain for 2 weeks.

EXAM:
ULTRASOUND ABDOMEN LIMITED RIGHT UPPER QUADRANT

[Series 1: ultrasound abdomen limited · 0.19mm/px · 14 of 64 slices shown]
[im 1/64]
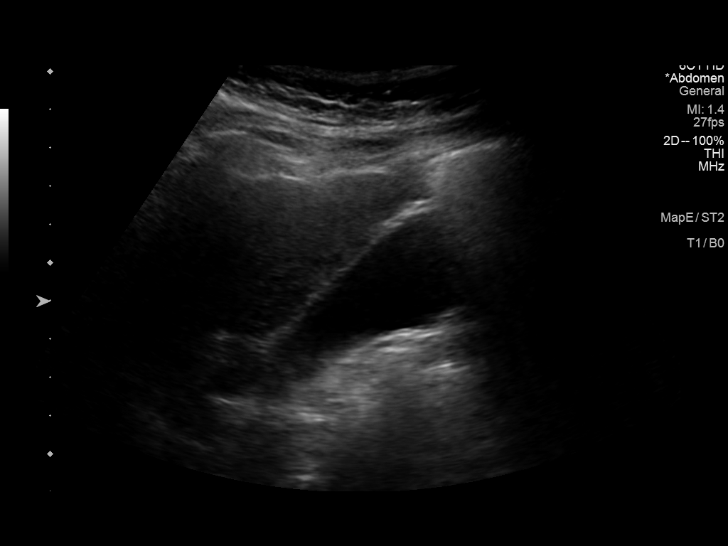
[im 6/64]
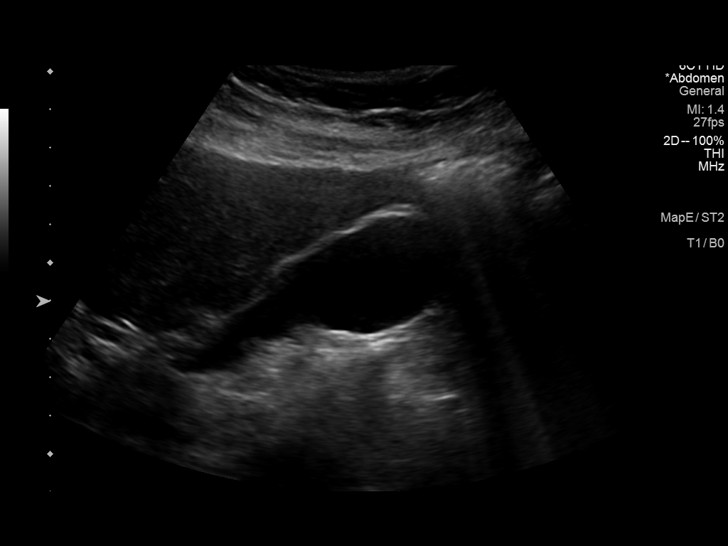
[im 11/64]
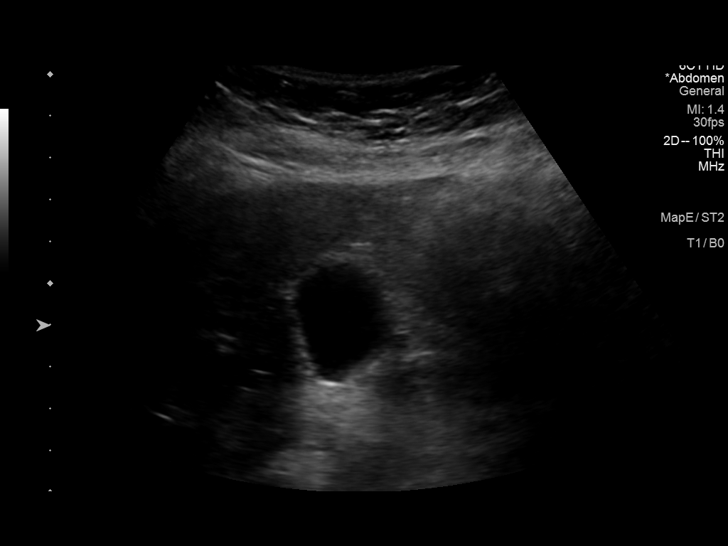
[im 16/64]
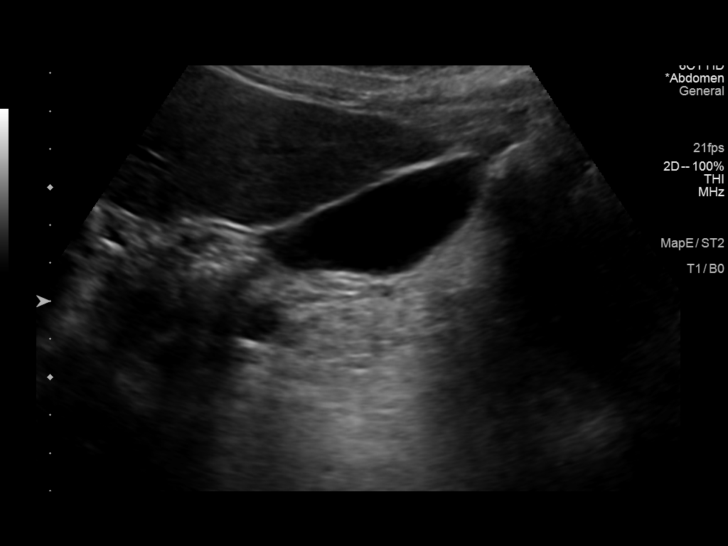
[im 22/64]
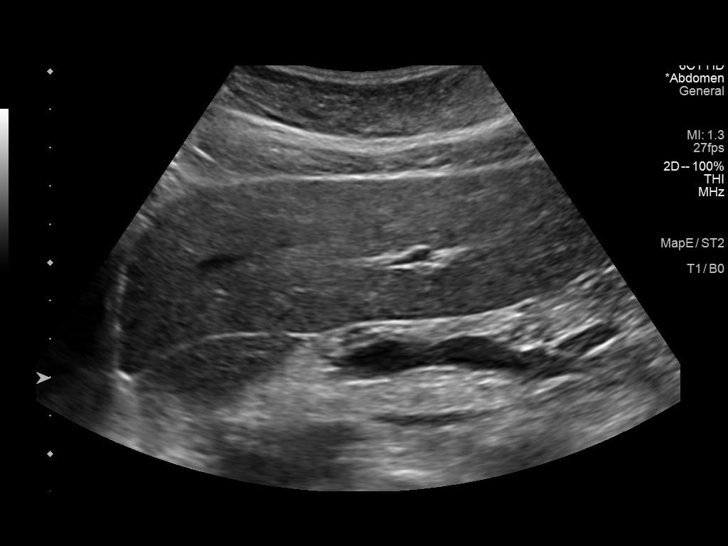
[im 24/64]
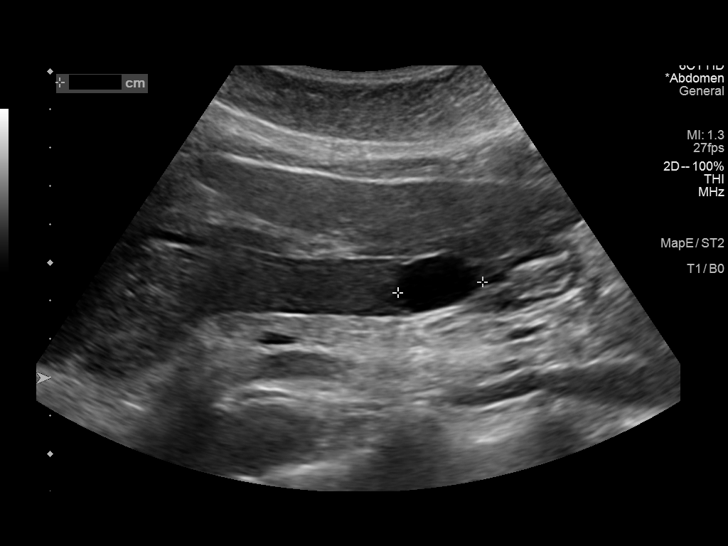
[im 29/64]
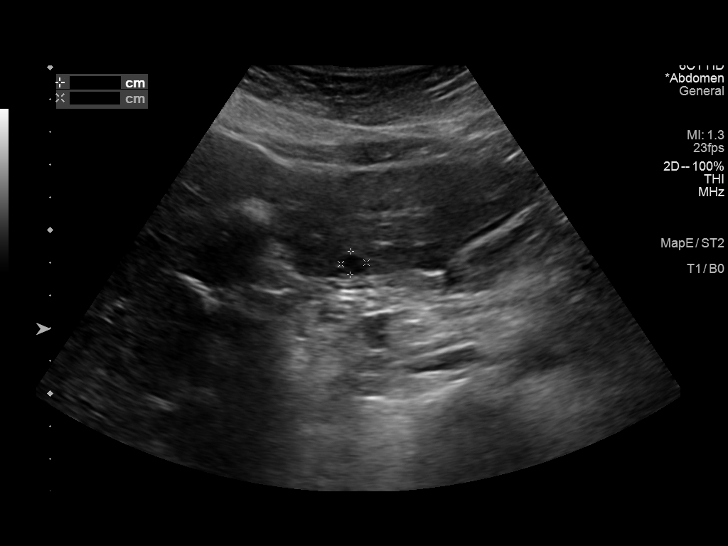
[im 35/64]
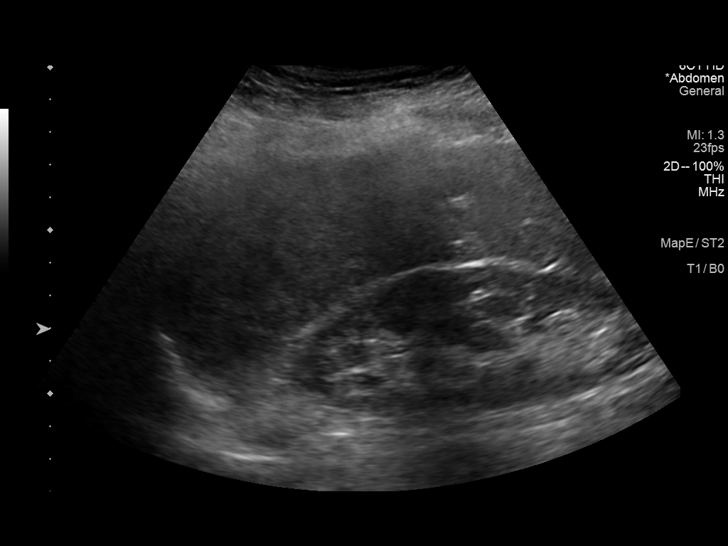
[im 40/64]
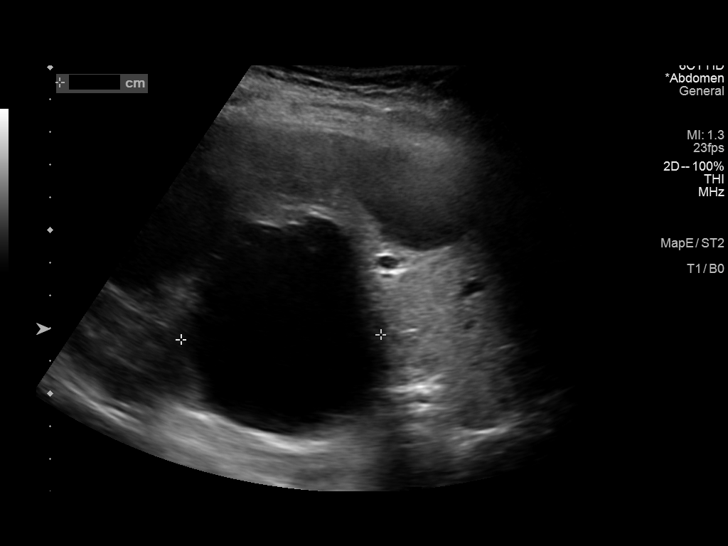
[im 43/64]
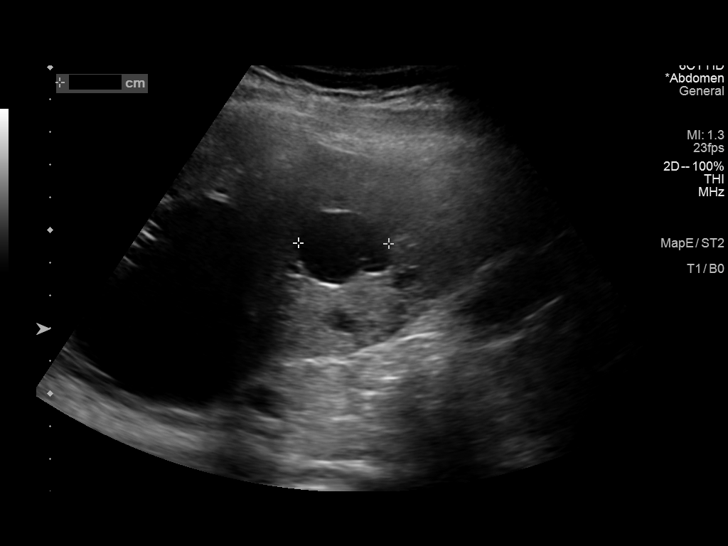
[im 48/64]
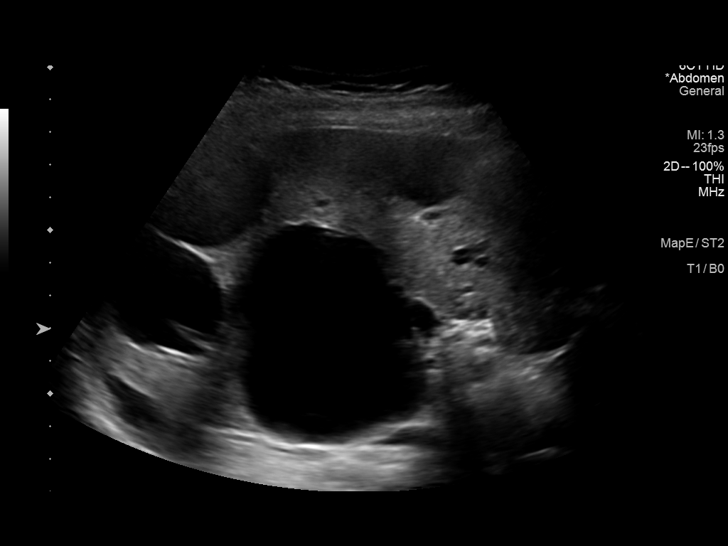
[im 53/64]
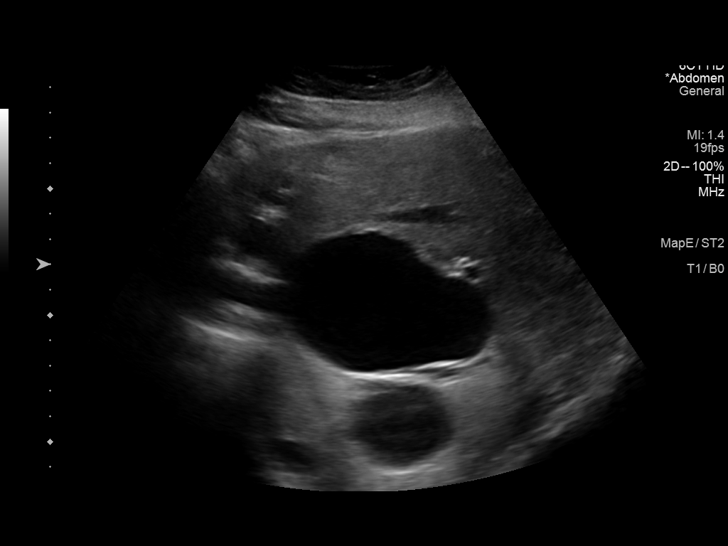
[im 58/64]
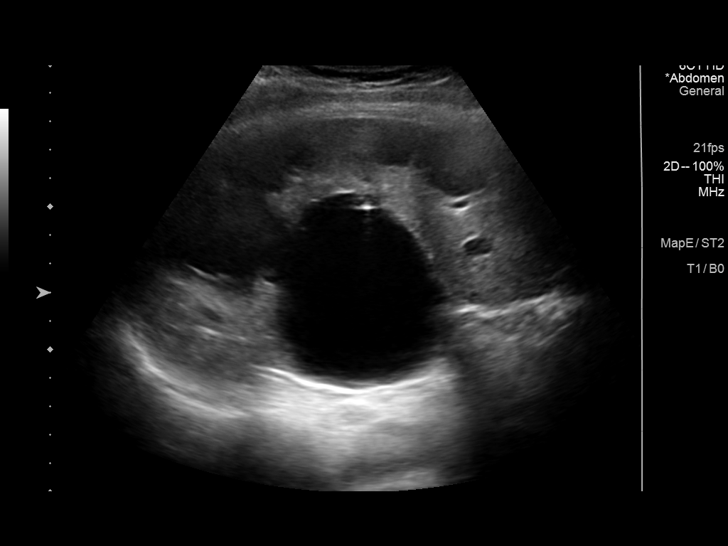
[im 64/64]
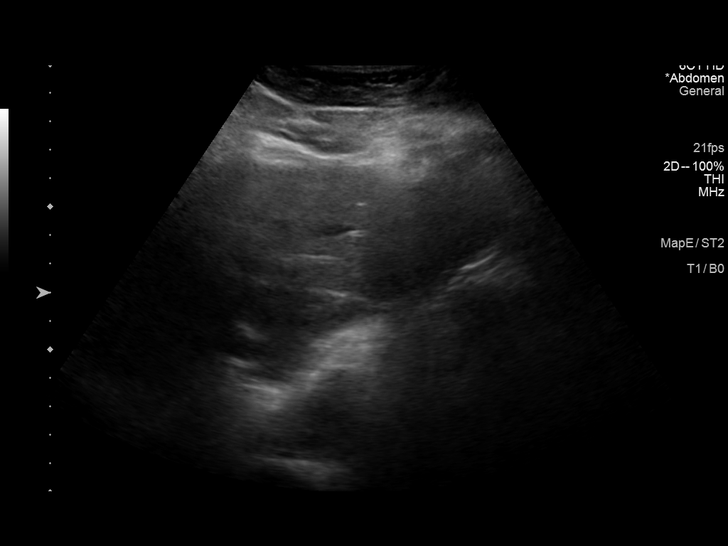

[14 of 25 positions shown; findings below may reference images not displayed]

FINDINGS: Gallbladder:

No gallstones or wall thickening visualized. No sonographic Murphy
sign noted by sonographer.

Common bile duct:

Diameter: 3 mm

Liver:

Numerous hepatic cysts, corresponding to the appearance on earlier
CT abdomen. No suspicious mass identified. Background liver
echogenicity is within normal limits. Portal vein is patent on color
Doppler imaging with normal direction of blood flow towards the
liver.

Other: None.
IMPRESSION: 1. No acute findings. No evidence of cholecystitis. No gallstones.
No bile duct dilatation.
2. Numerous hepatic cysts, corresponding to the appearance on
earlier CT abdomen of 12/22/2016.

## 2021-07-08 ENCOUNTER — Other Ambulatory Visit: Payer: Self-pay | Admitting: Gastroenterology

## 2021-07-17 NOTE — Progress Notes (Signed)
Cardiology Office Note   Date:  07/24/2021   ID:  Tanya, Thomas 06/17/1956, MRN [id number]  PCP:  Johny Blamer, MD  Cardiologist:   Shakila Mak [place], MD   Chief Complaint  Patient presents with   Chest Pain   Palpitations      History of Present Illness: Tanya Thomas is a 65 y.o. female who is seen at the request of Dr Tiburcio Pea for evaluation of palpitations. She has a history of mild HLD. I also see her husband Tanya Thomas.  She states she has had some palpitations for years. Thought these were PVCs. Worse with caffeine. She had Covid 19 in July. States she was sick for 50 days. Since then has noted more episodes where her heart "flutters". More noted at night. No dizziness or syncope. No significant chest pain. Palpitations are limited.   Past Medical History:  Diagnosis Date   Anemia    Arthritis    Asthma    Chronic fatigue syndrome    Complication of anesthesia    numerous medication allergies   Depression    Fibromyalgia    Goiter    multi-nodular goiter   H/O multiple allergies    extreme with "Sulfites"   Headache(784.0)    Hiatal hernia    Hyperlipemia    IBS (irritable bowel syndrome)    Lactose intolerance    Pneumonia    Reflux    Scoliosis    UTI (lower urinary tract infection)     Past Surgical History:  Procedure Laterality Date   COLONOSCOPY WITH PROPOFOL N/A 07/06/2014   Procedure: COLONOSCOPY WITH PROPOFOL;  Surgeon: Vertell Novak., MD;  Location: [facility];  Service: Endoscopy;  Laterality: N/A;   ESOPHAGOGASTRODUODENOSCOPY (EGD) WITH PROPOFOL N/A 07/06/2014   Procedure: ESOPHAGOGASTRODUODENOSCOPY (EGD) WITH PROPOFOL;  Surgeon: Vertell Novak., MD;  Location: [facility];  Service: Endoscopy;  Laterality: N/A;   FOOT SURGERY     bilateral bunion removal   NOSE SURGERY     TONSILLECTOMY       Current Outpatient Medications  Medication Sig Dispense Refill   albuterol (PROVENTIL) (2.5 MG/3ML) 0.083% nebulizer solution Take  2.5 mg by nebulization every 6 (six) hours as needed for wheezing or shortness of breath.      Cyanocobalamin (VITAMIN B-12) 3000 MCG SUBL Place under the tongue.     diphenhydrAMINE (BENADRYL) 12.5 MG/5ML liquid Take 50 mg by mouth every 8 (eight) hours as needed for allergies. childrens benadryl     EPINEPHrine 0.3 mg/0.3 mL IJ SOAJ injection AS DIRECTED AS NEEDED INTRAMUSCULAR 30     loratadine (CLARITIN) 5 MG/5ML syrup Take 10 mg by mouth as needed for allergies or rhinitis.      Melatonin 5 MG TABS Take by mouth.     OVER THE COUNTER MEDICATION Take 3-5 tablets by mouth at bedtime and may repeat dose one time if needed. (Solgar Brand) Calcium Magnesium Vitamin d3 400 iu Cholecalciferol  500 Magnesium Oxide 1000 Calcium Citrate     Probiotic Product (PROBIOTIC PO) Take by mouth.     No current facility-administered medications for this visit.    Allergies:   Sulfites, Alcohol-sulfur [elemental sulfur], Aspirin, Cocaine, Codeine, Corn starch, Corn syrup, Demerol [meperidine hcl], Doxycycline, Fluzone [influenza virus vaccine], Ibuprofen, Lactose intolerance (gi), Lemon flavor, Lemon oil, Monosodium glutamate, Nickel, Penicillins, Promethazine hcl, Sulfa antibiotics, Tylenol [acetaminophen], Wine [alcohol], Adhesive [tape], and Latex    Social History:  The patient  reports that she has  never smoked. She has never used smokeless tobacco. She reports that she does not drink alcohol and does not use drugs.   Family History:  The patient's family history includes Breast cancer in her maternal aunt; Cancer in her maternal grandmother; Colon cancer in her father; Heart disease in her maternal grandfather and paternal grandfather; Hypertension in her mother; Irritable bowel syndrome in her sister; Lung cancer in her maternal uncle; Pancreatic cancer in her maternal aunt; Prostate cancer in her maternal uncle; Stomach cancer in her maternal aunt.    ROS:  Please see the history of present illness.    Otherwise, review of systems are positive for none.   All other systems are reviewed and negative.    PHYSICAL EXAM: VS:  BP (!) 154/82   Pulse 71   Ht 5\' 6"  (1.676 m)   Wt 160 lb (72.6 kg)   LMP 03/30/2011   SpO2 97%   BMI 25.82 kg/m  , BMI Body mass index is 25.82 kg/m. GEN: Well nourished, well developed, in no acute distress HEENT: normal Neck: no JVD, carotid bruits, or masses Cardiac: RRR; no murmurs, rubs, or gallops,no edema  Respiratory:  clear to auscultation bilaterally, normal work of breathing GI: soft, nontender, nondistended, + BS MS: no deformity or atrophy Skin: warm and dry, no rash Neuro:  Strength and sensation are intact Psych: euthymic mood, full affect   EKG:  EKG is ordered today. The ekg ordered today demonstrates NSR rate 71. RAD. Otherwise normal. I have personally reviewed and interpreted this study.    Recent Labs: No results found for requested labs within last 8760 hours.    Lipid Panel No results found for: CHOL, TRIG, HDL, CHOLHDL, VLDL, LDLCALC, LDLDIRECT   Dated 05/20/21: cholesterol 184, triglycerides 142, HDL 49, LDL 110. A1c 5.6%. CMET,CBC, TSH normal.  Wt Readings from Last 3 Encounters:  07/24/21 160 lb (72.6 kg)  06/16/19 158 lb (71.7 kg)  12/22/16 178 lb (80.7 kg)      Other studies Reviewed: Additional studies/ records that were reviewed today include: CT of Abd/pelvis reviewed from 2018. Review of the above records demonstrates: no vascular calcification   ASSESSMENT AND PLAN:  1.  Palpitations. Labs and Ecg OK. Cardiac exam is normal. Will arrange for 2 week Zio patch monitor. If benign will reassure 2. Mild hypercholesterolemia. No history of vascular disease. Continue lifestyle modification.   Current medicines are reviewed at length with the patient today.  The patient does not have concerns regarding medicines.  The following changes have been made:  no change  Labs/ tests ordered today include:   Orders  Placed This Encounter  Procedures   LONG TERM MONITOR (3-14 DAYS)   EKG 12-Lead          Disposition:   FU pending results of monitor.  Signed, Daymond Cordts [place], MD  07/24/2021 9:43 AM    [facility] [address], [place], [place], [address] Phone [phone], Fax [phone]

## 2021-07-24 ENCOUNTER — Ambulatory Visit (INDEPENDENT_AMBULATORY_CARE_PROVIDER_SITE_OTHER): Payer: Commercial Managed Care - PPO

## 2021-07-24 ENCOUNTER — Encounter: Payer: Self-pay | Admitting: Cardiology

## 2021-07-24 ENCOUNTER — Ambulatory Visit (INDEPENDENT_AMBULATORY_CARE_PROVIDER_SITE_OTHER): Payer: BC Managed Care – PPO | Admitting: Cardiology

## 2021-07-24 ENCOUNTER — Other Ambulatory Visit: Payer: Self-pay

## 2021-07-24 VITALS — BP 154/82 | HR 71 | Ht 66.0 in | Wt 160.0 lb

## 2021-07-24 DIAGNOSIS — E7800 Pure hypercholesterolemia, unspecified: Secondary | ICD-10-CM

## 2021-07-24 DIAGNOSIS — R002 Palpitations: Secondary | ICD-10-CM

## 2021-07-24 NOTE — Patient Instructions (Signed)
Medication Instructions: Continue same medications   Lab Work: None ordered   Testing/Procedures: Zio Monitor  2 weeks   Follow-Up: At [facility], you and your health needs are our priority.  As part of our continuing mission to provide you with exceptional heart care, we have created designated Provider Care Teams.  These Care Teams include your primary Cardiologist (physician) and Advanced Practice Providers (APPs -  Physician Assistants and Nurse Practitioners) who all work together to provide you with the care you need, when you need it.  We recommend signing up for the patient portal called "MyChart".  Sign up information is provided on this After Visit Summary.  MyChart is used to connect with patients for Virtual Visits (Telemedicine).  Patients are able to view lab/test results, encounter notes, upcoming appointments, etc.  Non-urgent messages can be sent to your provider as well.   To learn more about what you can do with MyChart, go to [email or web address].      Your next appointment:  To Be Determined after monitor results    The format for your next appointment: Office   Provider:  Dr.Jordan

## 2021-07-24 NOTE — Progress Notes (Unsigned)
Enrolled patient for a 14 day Zio XT  monitor to be mailed to patients home  °

## 2021-07-25 ENCOUNTER — Encounter (HOSPITAL_COMMUNITY): Payer: Self-pay | Admitting: Gastroenterology

## 2021-07-29 ENCOUNTER — Encounter: Payer: Self-pay | Admitting: Cardiology

## 2021-07-29 DIAGNOSIS — R002 Palpitations: Secondary | ICD-10-CM

## 2021-07-29 DIAGNOSIS — E7800 Pure hypercholesterolemia, unspecified: Secondary | ICD-10-CM

## 2021-08-05 ENCOUNTER — Encounter (HOSPITAL_COMMUNITY): Payer: Self-pay | Admitting: Anesthesiology

## 2021-08-05 NOTE — H&P (Signed)
History of Present Illness  General:  65 year old female, previous patient of Dr. Oletta Lamas, presents to schedule a repeat colonoscopy. Patient has multiple drug allergies including severe allergy to sulfite and EDTA. Patient is requesting no sedation for a colonoscopy due to her allergies. Previous colonoscopy and EGD were done unsedated. Last colonoscopy done by Dr. Oletta Lamas 06/2014 showed a 7 mm adenomatous polyp removed from the cecum, and 1 cm inflammatory polyp removed from rectosigmoid colon. She has family history of her father who had colon cancer. She is overdue for a 5 year repeat colonoscopy. She has history of IBS with diarrhea alternating with constipation. Has occasional lower abdominal cramping. No recent abdominal pain. Denies rectal bleeding. Her son was diagnosed with H. pylori in the past few months. She is requesting to have a test for H. pylori. She has occasional episode of heartburn which is relieved with OTC antacids. She denies abdominal pain, dysphagia, weight loss, or other alarm symptoms. Previous EGD done 06/2014 was normal. No biopsies were done.   Current Medications  Taking   Albuterol Sulfate HFA 108 (90 Base) MCG/ACT Aerosol Solution TAKE 2 PUFFS AS NEEDED FOR WHEEZING INHALATION EVERY 4 HRS INHALATION 90 DAYS   Claritin(Loratadine) 5 MG/5ML Syrup 10 ml Orally as needed, Notes: as needed  diphenhydrAMINE HCl 12.5 MG/5ML Syrup DYE-FREE Orally as needed, Notes: as needed  Emergen-C Immune - Packet as directed Orally , Notes: gummies 2-3 daily  EPINEPHrine 0.3 MG/0.3ML Solution Auto-injector AS DIRECTED AS NEEDED INTRAMUSCULAR 30 DAYS   Lactaid(Lactase) 3000 UNIT Tablet as directed Orally daily  MVI 1 tab Oral , Notes: gummies 2-3 daily  Pepcid AC(Famotidine) 10 MG Tablet 1 tablet as needed Orally Twice a day  Probiotic - Capsule Orally , Notes: daily  Supplement: Hylands Leg Cramps , Notes: as needed  Vitamin B12 3000 MCG/ML Liquid Sublingual , Notes: drops,  occassionally   Past Medical History  multiple environmental allergies: sulfites, cats, foods, etc - Dr. Carmelina Peal.   Asthma.   Scoliosis.   mild IBS, lactose intolerance - Dr. Oletta Lamas.   Thyroid nodules.   RLS - Dr. Brett Fairy.   Pleurisy.   Pneumonia.   Arthritis since age 33.   Chronic pyelonephritis.   UTI.   10th cranial nerve problem - may pass out (?).   Gluten intolerance.   polycystic liver on CT scan 11/2016.   Prediabetes, dx'd 01/2017.   Mini liver cyst.    Surgical History  tonsillectomy 60's  bunionectomy mid 70's  deviated septum repair mid 31's  Endoscopy 07/06/2014  Colonoscopytooth extraction 07/06/2014   Family History  Father: deceased, hyperlipidemia; colon cancer, skin cancer, diagnosed with Hypertension, Colon cancer, Diabetes  Mother: deceased, diagnosed with Hypertension  Paternal [place] Father: deceased, MI age 81  Paternal [place] Mother: deceased, diabetes, diagnosed with Diabetes  Maternal Grand Father: deceased, MI age 65  Maternal [place] Mother: deceased, cancer, ovarian  Neg GI family hx of colon polyps, liver disease.   Social History  General:  Tobacco use  cigarettes: Never smoked Tobacco history last updated 06/25/2021 Vaping No no EXPOSURE TO PASSIVE SMOKE.  no Alcohol.  no Recreational drug use.  Marital Status: married.  Children: 6 adopted children from [place], 1 step child, adult.  OCCUPATION: SAHM Homeschooler.  Religion: Protestant.  Seat belt use: yes.    Allergies  Sulfites: throat swelling - Allergy  Aspirin: rash - Allergy  Penicillin (for allergy): rash - Allergy  Demerol: hypotension - Allergy  Advil: abdominal pain - Allergy  Phenergan:  hypotension - Allergy  Latex Gloves: itching - Allergy  Doxycycline Calcium: hypotension - Allergy  SMZ-TMP DS: hypotension - Allergy  Citrus: unsure - Allergy  Nuts: unsure - Allergy  Corn starch and corn syrup: unsure - Allergy  Dyes, perfumes, artificial  flavorings: unsure - Allergy  Fluzone: Itching, Raspy voice 2015  PEG: itching, rashes - Allergy  ten 20 conductive paste  mavadon lemon prep gel  Sulfa Antibiotics  Shellfish-derived Products  Nickel  Tylenol   Hospitalization/Major Diagnostic Procedure  Chronic pyelonephritis 1996  not in the past yr 06/2021   Review of Systems  GI PROCEDURE:  Pacemaker/ AICD no. Artificial heart valves no. MI/heart attack no. Abnormal heart rhythm no. Angina no. CVA no. Hypertension no. Hypotension no. Asthma, COPD YES asthma. Sleep apnea no. Seizure disorders no. Artificial joints no. Severe DJD no. Diabetes no. Significant headaches no. Vertigo YES. Depression/anxiety no. Abnormal bleeding no. Kidney Disease no. Liver disease no. Chance of pregnancy no. Blood transfusion no.     Vital Signs  Wt 154.8, Wt change -1.8 lb, Ht 66, BMI 24.98, Temp 97.2, Pulse sitting 68, BP sitting 131/87.   Examination  Gastroenterology Exam: GENERAL APPEARANCE: Well developed, well nourished, no active distress, pleasant, no acute distress.  SCLERA: anicteric.  RESPIRATORY Breath sounds clear to auscultation. No wheezes, rales or rhonchi. Respiration even and unlabored.  CARDIOVASCULAR Normal RRR w/o murmers or gallops. No peripheral edema.  ABDOMEN No masses palpated. Liver and spleen not palpated, normal. Bowel sounds normal, Abdomen not distended, soft, nontender.     Assessments     1. History of colon polyps - Z86.010 (Primary)   2. Family history of colon cancer in father - Z80.0   3. GERD (gastroesophageal reflux disease) - K21.9, Mild. Patient is requesting H. Pylori test. One of her son's recently had H. Pylori.   I am scheduling repeat colonoscopy given her previous history of adenomatous colon polyps and family history of colon cancer. Per patient's request, we will schedule unsedated colonoscopy due to her history of sulfite and drug allergies. She tolerated previous unsedated EGD and colonoscopy  procedures well by Dr. Oletta Lamas.   Treatment  1. History of colon polyps  IMAGING: Colonoscopy       Notes: I discussed risks and benefits of colonoscopy procedure with patient to include risk of bleeding or colon perforation. Patient expressed understanding and agrees to proceed with procedure.  Gave NuLytely (Sulfite Free) Colonoscopy Prep Instructions. Rx was called into pharmacy and pharmacist was made aware it must be Sulfite Free due to patient's sulfite allergy.

## 2021-08-06 ENCOUNTER — Ambulatory Visit (HOSPITAL_COMMUNITY)
Admission: RE | Admit: 2021-08-06 | Discharge: 2021-08-06 | Disposition: A | Discharge status: Home / Self Care | Payer: Commercial Managed Care - PPO | Attending: Gastroenterology | Admitting: Gastroenterology

## 2021-08-06 ENCOUNTER — Encounter (HOSPITAL_COMMUNITY)
Admission: RE | Disposition: A | Discharge status: Home / Self Care | Payer: Self-pay | Source: Home / Self Care | Attending: Gastroenterology

## 2021-08-06 ENCOUNTER — Other Ambulatory Visit: Payer: Self-pay

## 2021-08-06 ENCOUNTER — Encounter (HOSPITAL_COMMUNITY): Payer: Self-pay | Admitting: Gastroenterology

## 2021-08-06 DIAGNOSIS — Z1211 Encounter for screening for malignant neoplasm of colon: Secondary | ICD-10-CM | POA: Diagnosis present

## 2021-08-06 DIAGNOSIS — K5730 Diverticulosis of large intestine without perforation or abscess without bleeding: Secondary | ICD-10-CM | POA: Insufficient documentation

## 2021-08-06 DIAGNOSIS — Z800 Family history of malignant neoplasm of digestive organs: Secondary | ICD-10-CM | POA: Diagnosis not present

## 2021-08-06 DIAGNOSIS — K582 Mixed irritable bowel syndrome: Secondary | ICD-10-CM | POA: Diagnosis not present

## 2021-08-06 DIAGNOSIS — D120 Benign neoplasm of cecum: Secondary | ICD-10-CM | POA: Diagnosis not present

## 2021-08-06 DIAGNOSIS — D123 Benign neoplasm of transverse colon: Secondary | ICD-10-CM | POA: Insufficient documentation

## 2021-08-06 DIAGNOSIS — K219 Gastro-esophageal reflux disease without esophagitis: Secondary | ICD-10-CM | POA: Insufficient documentation

## 2021-08-06 HISTORY — PX: COLONOSCOPY: SHX5424

## 2021-08-06 HISTORY — PX: POLYPECTOMY: SHX5525

## 2021-08-06 SURGERY — COLONOSCOPY

## 2021-08-06 MED ORDER — EPINEPHRINE PF 1 MG/ML IJ SOLN
1.0000 mg | Freq: Once | INTRAMUSCULAR | Status: DC
  Filled 2021-08-06: qty 1

## 2021-08-06 MED ORDER — SODIUM CHLORIDE 0.9 % IV SOLN: INTRAVENOUS | Status: DC

## 2021-08-06 NOTE — Interval H&P Note (Signed)
History and Physical Interval Note: 65/female with history of adenoma removed in 2015 and family history of colon cancer in her father for a surveillance colonoscopy without sedation. 08/06/2021 10:03 AM  Tanya Thomas  has presented today for colonoscopy, with the diagnosis of Personal history colon polyps.  The various methods of treatment have been discussed with the patient and family. After consideration of risks, benefits and other options for treatment, the patient has consented to  Procedure(s) with comments: COLONOSCOPY (N/A) - UNSEDATED DUE TO MULTIPLE ALLERGIES INCLUDING SULFITE ALLERGY as a surgical intervention.  The patient's history has been reviewed, patient examined, no change in status, stable for surgery.  I have reviewed the patient's chart and labs.  Questions were answered to the patient's satisfaction.     Ronnette Juniper

## 2021-08-06 NOTE — Op Note (Signed)
[facility] Patient Name: Tanya Thomas Procedure Date: 08/06/2021 MRN: [id number] Attending MD: Ronnette Juniper , MD Date of Birth: [birth year] CSN: [id number] Age: 65 Admit Type: Outpatient Procedure:                Colonoscopy Indications:              Last colonoscopy: 2015, Follow-up for history of                            adenomatous polyps in the colon, Family history of                            colon cancer in a first-degree relative- Father in                            his 37s Providers:                Ronnette Juniper, MD, Burtis Junes, RN, Luan Moore,                            Technician, Danley Danker, CRNA Referring MD:             Darlyne Russian Medicines:                None Complications:            No immediate complications. Estimated blood loss:                            Minimal. Estimated Blood Loss:     Estimated blood loss was minimal. Procedure:                Pre-Anesthesia Assessment:                           - Prior to the procedure, a History and Physical                            was performed, and patient medications and                            allergies were reviewed. The patient's tolerance of                            previous anesthesia was also reviewed. The risks                            and benefits of the procedure and the sedation                            options and risks were discussed with the patient.                            All questions were answered, and informed consent                            was obtained.  Prior Anticoagulants: The patient has                            taken no previous anticoagulant or antiplatelet                            agents. ASA Grade Assessment: No sedation. After                            reviewing the risks and benefits, the patient was                            deemed in satisfactory condition to undergo the                            procedure.                            After obtaining informed consent, the colonoscope                            was passed under direct vision. Throughout the                            procedure, the patient's blood pressure, pulse, and                            oxygen saturations were monitored continuously. The                            PCF-HQ190L ([id number]) Olympus colonoscope was                            introduced through the anus and advanced to the the                            terminal ileum. The colonoscopy was performed                            without difficulty. The patient tolerated the                            procedure well. The quality of the bowel                            preparation was good. Scope In: 10:30:32 AM Scope Out: 10:58:12 AM Scope Withdrawal Time: 0 hours 16 minutes 51 seconds  Total Procedure Duration: 0 hours 27 minutes 40 seconds  Findings:      The perianal and digital rectal examinations were normal.      Five sessile polyps were found in the transverse colon. The polyps were       6 to 15 mm in size. These polyps were removed with a piecemeal technique       using a hot snare. Resection and retrieval were complete.  A 8 mm polyp was found in the cecum. The polyp was sessile. The polyp       was removed with a piecemeal technique using a hot snare. Resection and       retrieval were complete.      The terminal ileum appeared normal.      A few small-mouthed diverticula were found in the sigmoid colon.      The exam was otherwise without abnormality on direct and retroflexion       views. Impression:               - Five 6 to 15 mm polyps in the transverse colon,                            removed piecemeal using a hot snare. Resected and                            retrieved.                           - One 8 mm polyp in the cecum, removed piecemeal                            using a hot snare. Resected and retrieved.                           - The examined portion of  the ileum was normal.                           - Diverticulosis in the sigmoid colon.                           - The examination was otherwise normal on direct                            and retroflexion views. Moderate Sedation:      None Recommendation:           - Patient has a contact number available for                            emergencies. The signs and symptoms of potential                            delayed complications were discussed with the                            patient. Return to normal activities tomorrow.                            Written discharge instructions were provided to the                            patient.                           - High fiber diet.                           -  Continue present medications.                           - Await pathology results.                           - Repeat colonoscopy for surveillance based on                            pathology results. Procedure Code(s):        --- Professional ---                           [phone], Colonoscopy, flexible; with removal of                            tumor(s), polyp(s), or other lesion(s) by snare                            technique Diagnosis Code(s):        --- Professional ---                           K63.5, Polyp of colon                           Z86.010, Personal history of colonic polyps                           Z80.0, Family history of malignant neoplasm of                            digestive organs                           K57.30, Diverticulosis of large intestine without                            perforation or abscess without bleeding CPT copyright 2019 American Medical Association. All rights reserved. The codes documented in this report are preliminary and upon coder review may  be revised to meet current compliance requirements. Ronnette Juniper, MD 08/06/2021 11:06:21 AM This report has been signed electronically. Number of Addenda: 0

## 2021-08-07 ENCOUNTER — Encounter (HOSPITAL_COMMUNITY): Payer: Self-pay | Admitting: Gastroenterology

## 2021-08-07 LAB — SURGICAL PATHOLOGY

## 2021-08-07 NOTE — Telephone Encounter (Signed)
Spoke to patient she stated she only wore monitor for 4 days.She was allergic to the patch.She mailed monitor back this past Monday.I will make Dr.Jordan aware.I will call back with monitor results.

## 2021-11-25 ENCOUNTER — Other Ambulatory Visit: Payer: Self-pay | Admitting: Family Medicine

## 2021-11-25 DIAGNOSIS — E2839 Other primary ovarian failure: Secondary | ICD-10-CM

## 2021-12-06 NOTE — Progress Notes (Signed)
?  ?Cardiology Office Note ? ? ?Date:  12/10/2021  ? ?ID:  Tanya Thomas, DOB [birth year], MRN [id number] ? ?PCP:  Johny Blamer, MD  ?Cardiologist:   Doxie Augenstein [place], MD  ? ?Chief Complaint  ?Patient presents with  ? Palpitations  ? ? ?  ?History of Present Illness: ?Tanya Thomas is a 67 y.o. female who is for follow up of palpitations. She has a history of mild HLD. I also see her husband Tanya Thomas. ? ?She states she has had some palpitations for years. Thought these were PVCs. Worse with caffeine. She had Covid 19 in July. States she was sick for 50 days. Since then has noted more episodes where her heart "flutters". More noted at night. No dizziness or syncope. No significant chest pain. Palpitations are limited.  ? ?We had her wear an Zio patch monitor. This showed a few nonsustained runs of PAT. This appeared to correlate with symptoms. She is seen today for follow up.  ? ?Past Medical History:  ?Diagnosis Date  ? Anemia   ? Arthritis   ? Asthma   ? Chronic fatigue syndrome   ? Complication of anesthesia   ? numerous medication allergies  ? Depression   ? Fibromyalgia   ? Goiter   ? multi-nodular goiter  ? H/O multiple allergies   ? extreme with "Sulfites"  ? Headache(784.0)   ? Hiatal hernia   ? Hyperlipemia   ? IBS (irritable bowel syndrome)   ? Lactose intolerance   ? Pneumonia   ? Reflux   ? Scoliosis   ? UTI (lower urinary tract infection)   ? ? ?Past Surgical History:  ?Procedure Laterality Date  ? COLONOSCOPY N/A 08/06/2021  ? Procedure: COLONOSCOPY;  Surgeon: Kerin Salen, MD;  Location: [facility];  Service: Gastroenterology;  Laterality: N/A;  UNSEDATED DUE TO MULTIPLE ALLERGIES INCLUDING SULFITE ALLERGY  ? COLONOSCOPY WITH PROPOFOL N/A 07/06/2014  ? Procedure: COLONOSCOPY WITH PROPOFOL;  Surgeon: Vertell Novak., MD;  Location: [facility];  Service: Endoscopy;  Laterality: N/A;  ? ESOPHAGOGASTRODUODENOSCOPY (EGD) WITH PROPOFOL N/A 07/06/2014  ? Procedure: ESOPHAGOGASTRODUODENOSCOPY (EGD) WITH  PROPOFOL;  Surgeon: Vertell Novak., MD;  Location: [facility];  Service: Endoscopy;  Laterality: N/A;  ? FOOT SURGERY    ? bilateral bunion removal  ? NOSE SURGERY    ? POLYPECTOMY  08/06/2021  ? Procedure: POLYPECTOMY;  Surgeon: Kerin Salen, MD;  Location: [facility];  Service: Gastroenterology;;  ? TONSILLECTOMY    ? ? ? ?Current Outpatient Medications  ?Medication Sig Dispense Refill  ? albuterol (PROVENTIL) (2.5 MG/3ML) 0.083% nebulizer solution Take 2.5 mg by nebulization every 6 (six) hours as needed for wheezing or shortness of breath.     ? BLACK ELDERBERRY PO Take 1 lozenge by mouth daily as needed.    ? Cyanocobalamin (VITAMIN B-12) 3000 MCG SUBL Place 1 drop under the tongue once a week. [place] ?5000 Mcg    ? diphenhydrAMINE (BENADRYL) 12.5 MG/5ML liquid Take 12.5 mg by mouth every 8 (eight) hours as needed for allergies. childrens benadryl dye free    ? EPINEPHrine 0.3 mg/0.3 mL IJ SOAJ injection Inject 0.3 mg into the muscle as needed for anaphylaxis.    ? famotidine (PEPCID) 10 MG tablet Take 20 mg by mouth daily as needed (allergies).    ? Lactase (LACTAID PO) Take 1 tablet by mouth daily as needed (lactose).    ? loratadine (CLARITIN) 5 MG/5ML syrup Take 10 mg by mouth daily as  needed for allergies. Children    ? Multiple Vitamins-Minerals (EMERGEN-C IMMUNE PLUS PO) Take 3 tablets by mouth daily. With Vitamin D ?Gummies ?Natural    ? Multiple Vitamins-Minerals (MULTIVITAMIN WITH MINERALS) tablet Take 1-2 tablets by mouth daily. Garden of life ?Gummies    ? Neomycin-Bacitracin-Polymyxin (TRIPLE ANTIBIOTIC EX) Apply 1 application topically daily as needed (finger rash). cream    ? OVER THE COUNTER MEDICATION Take 1 tablet by mouth at bedtime. (Solgar Brand)  ?Calcium Magnesium/ per 5 tablets ?Vitamin d3 ?400 iu Cholecalciferol/ per 5 tablets ?500 Magnesium Oxide Citrate/ per 5 tablets ?1000 Calcium Citrate /per 5 tablets    ? Probiotic Product (PROBIOTIC PO) Take 1 capsule by mouth  daily. Garden of life ?50 billion    ? sodium chloride (OCEAN) 0.65 % SOLN nasal spray Place 1 spray into both nostrils as needed for congestion.    ? ?No current facility-administered medications for this visit.  ? ? ?Allergies:   Bee pollen, Chamomile, Coal tar extract, Corn starch, Fluzone [influenza virus vaccine], Mushroom extract complex, Peanut allergen powder-dnfp, Shellfish allergy, Sulfa antibiotics, Sulfites, Alcohol-sulfur [elemental sulfur], Aspirin, Cocaine, Codeine, Corn syrup, Cream of tartar [potassium bitartrate], Demerol [meperidine hcl], Doxycycline, Eye health [actical], Glucosamine-msm, Ibuprofen, Lactose intolerance (gi), Lemon flavor, Lemon oil, Monosodium glutamate, Nickel, Other, Penicillins, Pharmabase cosmetic [lecithin-isopropyl palmitate], Promethazine hcl, Red dye, Topical sulfur, Tylenol [acetaminophen], Wine [alcohol], Adhesive [tape], and Latex  ? ? ?Social History:  The patient  reports that she has never smoked. She has never used smokeless tobacco. She reports that she does not drink alcohol and does not use drugs.  ? ?Family History:  The patient's family history includes Breast cancer in her maternal aunt; Cancer in her maternal grandmother; Colon cancer in her father; Heart disease in her maternal grandfather and paternal grandfather; Hypertension in her mother; Irritable bowel syndrome in her sister; Lung cancer in her maternal uncle; Pancreatic cancer in her maternal aunt; Prostate cancer in her maternal uncle; Stomach cancer in her maternal aunt.  ? ? ?ROS:  Please see the history of present illness.   Otherwise, review of systems are positive for none.   All other systems are reviewed and negative.  ? ? ?PHYSICAL EXAM: ?VS:  BP 137/82   Pulse 64   Ht 5\' 6"  (1.676 m)   Wt 162 lb (73.5 kg)   LMP 03/30/2011   SpO2 97%   BMI 26.15 kg/m?  , BMI Body mass index is 26.15 kg/m?. ?GEN: Well nourished, well developed, in no acute distress ?HEENT: normal ?Neck: no JVD, carotid  bruits, or masses ?Cardiac: RRR; no murmurs, rubs, or gallops,no edema  ?Respiratory:  clear to auscultation bilaterally, normal work of breathing ?GI: soft, nontender, nondistended, + BS ?MS: no deformity or atrophy ?Skin: warm and dry, no rash ?Neuro:  Strength and sensation are intact ?Psych: euthymic mood, full affect ? ? ?EKG:  EKG is not ordered today. ? ? ? ?Recent Labs: ?No results found for requested labs within last 8760 hours.  ? ? ?Lipid Panel ?No results found for: CHOL, TRIG, HDL, CHOLHDL, VLDL, LDLCALC, LDLDIRECT ?  ?Dated 05/20/21: cholesterol 184, triglycerides 142, HDL 49, LDL 110. A1c 5.6%. CMET,CBC, TSH normal. ? ?Wt Readings from Last 3 Encounters:  ?12/10/21 162 lb (73.5 kg)  ?08/06/21 160 lb (72.6 kg)  ?07/24/21 160 lb (72.6 kg)  ?  ? ? ?Other studies Reviewed: ?Additional studies/ records that were reviewed today include: CT of Abd/pelvis reviewed from 2018. ?Review of the above records  demonstrates: no vascular calcification ? ?Event monitor 08/09/21: Study Highlights ? ?  ?NSR ?Few episodes of PAT - total of 14 episodes. longest lasting 14 beats. symptoms appear to correlate with this. ?Rare PACs and PVCs otherwise. ?  ?  ?Patch Wear Time:  4 days and 20 hours (2022-12-05T17:22:05-499 to 2022-12-10T13:52:47-0500) ?  ?Patient had a min HR of 48 bpm, max HR of 193 bpm, and avg HR of 70 bpm. Predominant underlying rhythm was Sinus Rhythm. 14 Supraventricular Tachycardia runs occurred, the run with the fastest interval lasting 14 beats with a max rate of 193 bpm (avg 168 ? bpm); the run with the fastest interval was also the longest. Some episodes of Supraventricular Tachycardia may be possible Atrial Tachycardia with variable block. Supraventricular Tachycardia was detected within +/- 45 seconds of symptomatic patient  ?event(s). Isolated SVEs were rare (<1.0%), SVE Couplets were rare (<1.0%), and SVE Triplets were rare (<1.0%). Isolated VEs were rare (<1.0%), VE Couplets were rare (<1.0%),  and no VE Triplets were present ? ?ASSESSMENT AND PLAN: ? ?1.  Palpitations. Labs and Ecg OK. Cardiac exam is normal. Monitor showed brief short runs < 14 beats of PAT. We discussed that this is benign. Should avoid

## 2021-12-10 ENCOUNTER — Encounter: Payer: Self-pay | Admitting: Cardiology

## 2021-12-10 ENCOUNTER — Ambulatory Visit (INDEPENDENT_AMBULATORY_CARE_PROVIDER_SITE_OTHER): Payer: Commercial Managed Care - PPO | Admitting: Cardiology

## 2021-12-10 VITALS — BP 137/82 | HR 64 | Ht 66.0 in | Wt 162.0 lb

## 2021-12-10 DIAGNOSIS — R002 Palpitations: Secondary | ICD-10-CM | POA: Diagnosis not present

## 2021-12-10 DIAGNOSIS — I471 Supraventricular tachycardia: Secondary | ICD-10-CM

## 2022-01-21 ENCOUNTER — Other Ambulatory Visit: Payer: Self-pay | Admitting: Family Medicine

## 2022-01-21 DIAGNOSIS — L819 Disorder of pigmentation, unspecified: Secondary | ICD-10-CM

## 2022-01-27 ENCOUNTER — Ambulatory Visit
Admission: RE | Admit: 2022-01-27 | Discharge: 2022-01-27 | Disposition: A | Discharge status: Home / Self Care | Payer: Commercial Managed Care - PPO | Source: Ambulatory Visit | Attending: Family Medicine | Admitting: Family Medicine

## 2022-01-27 DIAGNOSIS — L819 Disorder of pigmentation, unspecified: Secondary | ICD-10-CM

## 2022-05-16 ENCOUNTER — Ambulatory Visit
Admission: RE | Admit: 2022-05-16 | Discharge: 2022-05-16 | Disposition: A | Discharge status: Home / Self Care | Payer: Commercial Managed Care - PPO | Source: Ambulatory Visit | Attending: Family Medicine | Admitting: Family Medicine

## 2022-05-16 DIAGNOSIS — E2839 Other primary ovarian failure: Secondary | ICD-10-CM

## 2023-04-04 ENCOUNTER — Encounter (HOSPITAL_BASED_OUTPATIENT_CLINIC_OR_DEPARTMENT_OTHER): Payer: Self-pay

## 2023-04-04 ENCOUNTER — Emergency Department (HOSPITAL_BASED_OUTPATIENT_CLINIC_OR_DEPARTMENT_OTHER)
Admission: EM | Admit: 2023-04-04 | Discharge: 2023-04-04 | Disposition: A | Discharge status: Home / Self Care | Payer: Commercial Managed Care - PPO | Source: Home / Self Care | Attending: Emergency Medicine | Admitting: Emergency Medicine

## 2023-04-04 ENCOUNTER — Other Ambulatory Visit: Payer: Self-pay

## 2023-04-04 ENCOUNTER — Emergency Department (HOSPITAL_BASED_OUTPATIENT_CLINIC_OR_DEPARTMENT_OTHER): Payer: Commercial Managed Care - PPO

## 2023-04-04 DIAGNOSIS — Z91040 Latex allergy status: Secondary | ICD-10-CM | POA: Diagnosis not present

## 2023-04-04 DIAGNOSIS — Z20822 Contact with and (suspected) exposure to covid-19: Secondary | ICD-10-CM | POA: Diagnosis not present

## 2023-04-04 DIAGNOSIS — J111 Influenza due to unidentified influenza virus with other respiratory manifestations: Secondary | ICD-10-CM | POA: Diagnosis not present

## 2023-04-04 DIAGNOSIS — R1110 Vomiting, unspecified: Secondary | ICD-10-CM | POA: Insufficient documentation

## 2023-04-04 DIAGNOSIS — R059 Cough, unspecified: Secondary | ICD-10-CM | POA: Diagnosis present

## 2023-04-04 LAB — RESP PANEL BY RT-PCR (RSV, FLU A&B, COVID)  RVPGX2
Influenza A by PCR: POSITIVE — AB
Influenza B by PCR: NEGATIVE
Resp Syncytial Virus by PCR: NEGATIVE
SARS Coronavirus 2 by RT PCR: NEGATIVE

## 2023-04-04 MED ORDER — ONDANSETRON 4 MG PO TBDP: 4.0000 mg | ORAL_TABLET | Freq: Three times a day (TID) | ORAL | 0 refills | Status: DC | PRN

## 2023-04-04 MED ORDER — ONDANSETRON 4 MG PO TBDP
4.0000 mg | ORAL_TABLET | Freq: Three times a day (TID) | ORAL | 0 refills | Status: AC | PRN
Start: 2023-04-04 — End: ?

## 2023-04-04 MED ORDER — SODIUM CHLORIDE 0.9 % IV BOLUS
1000.0000 mL | Freq: Once | INTRAVENOUS | Status: AC
  Administered 2023-04-04: 1000 mL via INTRAVENOUS

## 2023-04-04 MED ORDER — ONDANSETRON 4 MG PO TBDP
4.0000 mg | ORAL_TABLET | Freq: Once | ORAL | Status: DC
  Filled 2023-04-04: qty 1

## 2023-04-04 MED ORDER — OSELTAMIVIR PHOSPHATE 75 MG PO CAPS
75.0000 mg | ORAL_CAPSULE | Freq: Two times a day (BID) | ORAL | 0 refills | Status: DC
Start: 2023-04-04 — End: 2023-04-04

## 2023-04-04 MED ORDER — OSELTAMIVIR PHOSPHATE 75 MG PO CAPS
75.0000 mg | ORAL_CAPSULE | Freq: Two times a day (BID) | ORAL | 0 refills | Status: AC
Start: 2023-04-04 — End: ?

## 2023-04-04 NOTE — Discharge Instructions (Addendum)
You were seen in the emergency department today for vomiting as well as upper respiratory symptoms.  Your viral panel testing did show that you are positive for influenza A.  I have sent a prescription of Tamiflu to your pharmacy.  I have also sent a prescription for Zofran to your pharmacy for continued nausea control.  Please take these as prescribed for the next few days to manage her symptoms.  If you have any acute worsening or symptoms, please return the emergency department for repeat evaluation.

## 2023-04-04 NOTE — ED Provider Notes (Signed)
[facility] Provider Note   CSN: [id number] Arrival date & time: 04/04/23  1325     History Chief Complaint  Patient presents with   Emesis    Tanya Thomas is a 67 y.o. female.  Patient presents to the emergency department complaints of vomiting as well as left upper respiratory viral infection symptoms.  Reports that multiple dividual's at home and recently tested positive for influenza and is concerned about possible influenza.  Denies any chest pain, shortness of breath, diarrhea, abdominal pain.  States that the vomiting began earlier today and is unable to tolerate much oral intake.  Has extensive allergy history and unsure if she is able to take any antiemetic medications.   Emesis Associated symptoms: cough and fever        Home Medications Prior to Admission medications   Medication Sig Start Date End Date Taking? Authorizing Provider  albuterol (PROVENTIL) (2.5 MG/3ML) 0.083% nebulizer solution Take 2.5 mg by nebulization every 6 (six) hours as needed for wheezing or shortness of breath.     [provider]  BLACK ELDERBERRY PO Take 1 lozenge by mouth daily as needed.    [provider]  Cyanocobalamin (VITAMIN B-12) 3000 MCG SUBL Place 1 drop under the tongue once a week. [place] 5000 Mcg    [provider]  diphenhydrAMINE (BENADRYL) 12.5 MG/5ML liquid Take 12.5 mg by mouth every 8 (eight) hours as needed for allergies. childrens benadryl dye free    [provider]  EPINEPHrine 0.3 mg/0.3 mL IJ SOAJ injection Inject 0.3 mg into the muscle as needed for anaphylaxis. 03/24/19   [provider]  famotidine (PEPCID) 10 MG tablet Take 20 mg by mouth daily as needed (allergies).    [provider]  Lactase (LACTAID PO) Take 1 tablet by mouth daily as needed (lactose).    [provider]  loratadine (CLARITIN) 5 MG/5ML syrup Take 10 mg by mouth daily as needed for  allergies. Children    [provider]  Multiple Vitamins-Minerals (EMERGEN-C IMMUNE PLUS PO) Take 3 tablets by mouth daily. With Vitamin D Gummies Natural    [provider]  Multiple Vitamins-Minerals (MULTIVITAMIN WITH MINERALS) tablet Take 1-2 tablets by mouth daily. Garden of life Gummies    [provider]  Neomycin-Bacitracin-Polymyxin (TRIPLE ANTIBIOTIC EX) Apply 1 application topically daily as needed (finger rash). cream    [provider]  ondansetron (ZOFRAN-ODT) 4 MG disintegrating tablet Take 1 tablet (4 mg total) by mouth every 8 (eight) hours as needed for nausea or vomiting. 04/04/23   Smitty Knudsen, PA-C  oseltamivir (TAMIFLU) 75 MG capsule Take 1 capsule (75 mg total) by mouth every 12 (twelve) hours. 04/04/23   Smitty Knudsen, PA-C  OVER THE COUNTER MEDICATION Take 1 tablet by mouth at bedtime. (Solgar Brand)  Calcium Magnesium/ per 5 tablets Vitamin d3 400 iu Cholecalciferol/ per 5 tablets 500 Magnesium Oxide Citrate/ per 5 tablets 1000 Calcium Citrate /per 5 tablets    [provider]  Probiotic Product (PROBIOTIC PO) Take 1 capsule by mouth daily. Garden of life 50 billion    [provider]  sodium chloride (OCEAN) 0.65 % SOLN nasal spray Place 1 spray into both nostrils as needed for congestion.    [provider]      Allergies    Bee pollen, Chamomile, Coal tar extract, Corn starch, Fluzone [influenza virus vaccine], Mushroom extract complex, Peanut allergen powder-dnfp, Shellfish allergy,  Sulfa antibiotics, Sulfites, Alcohol-sulfur [elemental sulfur], Aspirin, Cocaine, Codeine, Corn syrup, Cream of tartar [potassium bitartrate], Demerol [meperidine hcl], Doxycycline, Eye health [actical], Glucosamine-msm, Ibuprofen, Lactose intolerance (gi), Lemon flavor, Lemon oil, Monosodium glutamate, Nickel, Other, Penicillins, Pharmabase cosmetic [lecithin-isopropyl palmitate], Promethazine hcl, Red dye, Topical  sulfur, Tylenol [acetaminophen], Wine [alcohol], Adhesive [tape], and Latex    Review of Systems   Review of Systems  Constitutional:  Positive for fever.  Respiratory:  Positive for cough.   Gastrointestinal:  Positive for vomiting.  All other systems reviewed and are negative.   Physical Exam Updated Vital Signs BP (!) 147/80 (BP Location: Left Arm)   Pulse 89   Temp 99.9 F (37.7 C) (Oral)   Resp 18   Ht 5\' 6"  (1.676 m)   Wt 78 kg   LMP 03/30/2011   SpO2 97%   BMI 27.76 kg/m  Physical Exam Vitals and nursing note reviewed.  Constitutional:      General: She is not in acute distress.    Appearance: She is well-developed.  HENT:     Head: Normocephalic and atraumatic.  Eyes:     Extraocular Movements: Extraocular movements intact.     Conjunctiva/sclera: Conjunctivae normal.     Pupils: Pupils are equal, round, and reactive to light.  Cardiovascular:     Rate and Rhythm: Normal rate and regular rhythm.     Heart sounds: No murmur heard. Pulmonary:     Effort: Pulmonary effort is normal. No respiratory distress.     Breath sounds: Normal breath sounds. No wheezing.  Abdominal:     Palpations: Abdomen is soft.     Tenderness: There is no abdominal tenderness.  Musculoskeletal:        General: No swelling.     Cervical back: Neck supple.  Skin:    General: Skin is warm and dry.     Capillary Refill: Capillary refill takes less than 2 seconds.  Neurological:     Mental Status: She is alert.  Psychiatric:        Mood and Affect: Mood normal.     ED Results / Procedures / Treatments   Labs (all labs ordered are listed, but only abnormal results are displayed) Labs Reviewed  RESP PANEL BY RT-PCR (RSV, FLU A&B, COVID)  RVPGX2 - Abnormal; Notable for the following components:      Result Value   Influenza A by PCR POSITIVE (*)    All other components within normal limits    EKG None  Radiology DG Chest Portable 1 View  Result Date: 04/04/2023 CLINICAL  DATA:  Positive with vomiting and headache EXAM: PORTABLE CHEST 1 VIEW COMPARISON:  Chest radiograph dated 12/25/2004 FINDINGS: Normal lung volumes. No focal consolidations. No pleural effusion or pneumothorax. The heart size and mediastinal contours are within normal limits. No acute osseous abnormality. IMPRESSION: No active disease. Electronically Signed   By: Agustin Cree M.D.   On: 04/04/2023 19:06    Procedures Procedures   Medications Ordered in ED Medications  ondansetron (ZOFRAN-ODT) disintegrating tablet 4 mg (4 mg Oral Patient Refused/Not Given 04/04/23 1547)  sodium chloride 0.9 % bolus 1,000 mL (0 mLs Intravenous Stopped 04/04/23 1805)    ED Course/ Medical Decision Making/ A&P                               Medical Decision Making Amount and/or Complexity of Data Reviewed Radiology: ordered.  Risk Prescription drug management.  This patient presents to the ED for concern of vomiting.  Differential diagnosis includes influenza, COVID-19, gastroenteritis, bowel obstruction, UTI   Lab Tests:  I Ordered, and personally interpreted labs.  The pertinent results include: Respiratory viral panel positive for influenza   Imaging Studies ordered:  I ordered imaging studies including chest x-ray I independently visualized and interpreted imaging which showed no acute cardiopulmonary process I agree with the radiologist interpretation   Medicines ordered and prescription drug management:  I ordered medication including fluids for dehydration Reevaluation of the patient after these medicines showed that the patient improved I have reviewed the patients home medicines and have made adjustments as needed   Problem List / ED Course:  Patient reports to the emergency department concerns of vomiting as well as viral symptoms.  Reports multiple individuals in her household tested positive for influenza over the last day or so.  Currently Dors seeing mild cough, congestion, fever.   Denies any chest pain shortness of breath.  States that she typically developed pneumonia with influenza infections. Respiratory viral panel ordered from triage and positive for influenza A.  Physical exam is unremarkable for any evidence of abnormal lung sounds and no acute findings suggestive dehydration.  Will administer fluids given patient reporting 6 episodes of vomiting.  Zofran ordered but patient reports that she is unsure if she can take this due to her allergy history. Chest x-ray negative for any signs of pneumonia or other acute cardiopulmonary findings.  Prescription for Tamiflu sent to patient's pharmacy for management of symptoms at home.  Patient is agreeable with this plan verbalized understanding all return precautions.  Considered admission for vomiting and viral syndrome but patient appears to be stable with no acute decline noted here in the emergency department.  Patient feels comfortable and safe for outpatient management and discharge home. All questions answered prior to patient discharge.  Final Clinical Impression(s) / ED Diagnoses Final diagnoses:  Influenza    Rx / DC Orders ED Discharge Orders          Ordered    oseltamivir (TAMIFLU) 75 MG capsule  Every 12 hours,   Status:  Discontinued        04/04/23 1801    ondansetron (ZOFRAN-ODT) 4 MG disintegrating tablet  Every 8 hours PRN,   Status:  Discontinued        04/04/23 1801    oseltamivir (TAMIFLU) 75 MG capsule  Every 12 hours        04/04/23 1912    ondansetron (ZOFRAN-ODT) 4 MG disintegrating tablet  Every 8 hours PRN        04/04/23 1912              Smitty Knudsen, PA-C 04/04/23 1917    Charlynne Pander, MD 04/04/23 [phone]

## 2023-04-04 NOTE — ED Triage Notes (Signed)
Patient is having vomiting and headache. Her son has flu A and she thinks she has that. She denied weakness, tingling, numbness, slurred speech.
